# Patient Record
Sex: Male | Born: 1970
Health system: Southern US, Community
[De-identification: ages and names within clinical notes are randomized; demographics above are authoritative.]

## PROBLEM LIST (undated history)

## (undated) DIAGNOSIS — M1990 Unspecified osteoarthritis, unspecified site: Secondary | ICD-10-CM

## (undated) DIAGNOSIS — F32A Depression, unspecified: Secondary | ICD-10-CM

## (undated) DIAGNOSIS — R634 Abnormal weight loss: Secondary | ICD-10-CM

## (undated) DIAGNOSIS — R61 Generalized hyperhidrosis: Secondary | ICD-10-CM

## (undated) DIAGNOSIS — G4730 Sleep apnea, unspecified: Secondary | ICD-10-CM

## (undated) DIAGNOSIS — G43909 Migraine, unspecified, not intractable, without status migrainosus: Secondary | ICD-10-CM

## (undated) DIAGNOSIS — J302 Other seasonal allergic rhinitis: Secondary | ICD-10-CM

## (undated) DIAGNOSIS — K219 Gastro-esophageal reflux disease without esophagitis: Secondary | ICD-10-CM

## (undated) DIAGNOSIS — M25562 Pain in left knee: Secondary | ICD-10-CM

## (undated) DIAGNOSIS — F4310 Post-traumatic stress disorder, unspecified: Secondary | ICD-10-CM

## (undated) DIAGNOSIS — E0500 Thyrotoxicosis with diffuse goiter without thyrotoxic crisis or storm: Secondary | ICD-10-CM

## (undated) DIAGNOSIS — G4733 Obstructive sleep apnea (adult) (pediatric): Secondary | ICD-10-CM

## (undated) DIAGNOSIS — R200 Anesthesia of skin: Secondary | ICD-10-CM

## (undated) DIAGNOSIS — M549 Dorsalgia, unspecified: Secondary | ICD-10-CM

## (undated) DIAGNOSIS — M542 Cervicalgia: Secondary | ICD-10-CM

## (undated) DIAGNOSIS — R358 Other polyuria: Secondary | ICD-10-CM

## (undated) DIAGNOSIS — M2550 Pain in unspecified joint: Secondary | ICD-10-CM

## (undated) DIAGNOSIS — R5383 Other fatigue: Secondary | ICD-10-CM

## (undated) DIAGNOSIS — R202 Paresthesia of skin: Secondary | ICD-10-CM

## (undated) DIAGNOSIS — R0789 Other chest pain: Secondary | ICD-10-CM

## (undated) HISTORY — DX: Pain in left knee: M25.562

## (undated) HISTORY — DX: Pain in unspecified joint: M25.50

## (undated) HISTORY — PX: KNEE SURGERY: SHX244

## (undated) HISTORY — DX: Post-traumatic stress disorder, unspecified: F43.10

## (undated) HISTORY — PX: SHOULDER ARTHROSCOPY WITH DEBRIDEMENT AND BICEP TENDON REPAIR: SHX5690

## (undated) HISTORY — DX: Cervicalgia: M54.2

## (undated) HISTORY — DX: Other seasonal allergic rhinitis: J30.2

## (undated) HISTORY — DX: Sleep apnea, unspecified: G47.30

## (undated) HISTORY — DX: Dorsalgia, unspecified: M54.9

## (undated) HISTORY — DX: Other fatigue: R53.83

## (undated) HISTORY — DX: Morbid (severe) obesity due to excess calories: E66.01

## (undated) HISTORY — PX: SHOULDER SURGERY: SHX246

## (undated) HISTORY — DX: Anesthesia of skin: R20.0

## (undated) HISTORY — DX: Obstructive sleep apnea (adult) (pediatric): G47.33

## (undated) HISTORY — DX: Other chest pain: R07.89

## (undated) HISTORY — DX: Abnormal weight loss: R63.4

## (undated) HISTORY — DX: Paresthesia of skin: R20.2

## (undated) HISTORY — DX: Generalized hyperhidrosis: R61

## (undated) HISTORY — DX: Migraine, unspecified, not intractable, without status migrainosus: G43.909

## (undated) HISTORY — DX: Other polyuria: R35.8

## (undated) HISTORY — DX: Thyrotoxicosis with diffuse goiter without thyrotoxic crisis or storm: E05.00

## (undated) HISTORY — PX: OTHER SURGICAL HISTORY: SHX169

## (undated) HISTORY — DX: Depression, unspecified: F32.A

---

## 1998-09-25 ENCOUNTER — Encounter: Payer: Self-pay | Admitting: Neurosurgery

## 1998-09-25 ENCOUNTER — Ambulatory Visit (HOSPITAL_COMMUNITY): Admission: RE | Admit: 1998-09-25 | Discharge: 1998-09-25 | Payer: Self-pay | Admitting: Neurosurgery

## 2001-06-11 ENCOUNTER — Ambulatory Visit (HOSPITAL_BASED_OUTPATIENT_CLINIC_OR_DEPARTMENT_OTHER): Admission: RE | Admit: 2001-06-11 | Discharge: 2001-06-11 | Payer: Self-pay | Admitting: Orthopedic Surgery

## 2002-03-18 ENCOUNTER — Ambulatory Visit (HOSPITAL_BASED_OUTPATIENT_CLINIC_OR_DEPARTMENT_OTHER): Admission: RE | Admit: 2002-03-18 | Discharge: 2002-03-18 | Payer: Self-pay | Admitting: Orthopedic Surgery

## 2002-03-19 ENCOUNTER — Encounter: Payer: Self-pay | Admitting: Emergency Medicine

## 2002-03-19 ENCOUNTER — Emergency Department (HOSPITAL_COMMUNITY): Admission: EM | Admit: 2002-03-19 | Discharge: 2002-03-19 | Payer: Self-pay | Admitting: Emergency Medicine

## 2002-12-06 ENCOUNTER — Encounter: Admission: RE | Admit: 2002-12-06 | Discharge: 2002-12-06 | Payer: Self-pay | Admitting: Orthopedic Surgery

## 2002-12-06 ENCOUNTER — Encounter: Payer: Self-pay | Admitting: Orthopedic Surgery

## 2003-02-13 ENCOUNTER — Encounter: Payer: Self-pay | Admitting: Urology

## 2003-02-13 ENCOUNTER — Ambulatory Visit (HOSPITAL_COMMUNITY): Admission: RE | Admit: 2003-02-13 | Discharge: 2003-02-13 | Payer: Self-pay | Admitting: Urology

## 2003-12-18 ENCOUNTER — Encounter: Admission: RE | Admit: 2003-12-18 | Discharge: 2003-12-18 | Payer: Self-pay | Admitting: Sports Medicine

## 2004-06-11 ENCOUNTER — Emergency Department (HOSPITAL_COMMUNITY): Admission: EM | Admit: 2004-06-11 | Discharge: 2004-06-11 | Payer: Self-pay | Admitting: Emergency Medicine

## 2005-02-10 ENCOUNTER — Emergency Department (HOSPITAL_COMMUNITY): Admission: EM | Admit: 2005-02-10 | Discharge: 2005-02-11 | Payer: Self-pay | Admitting: Emergency Medicine

## 2006-03-03 ENCOUNTER — Encounter: Admission: RE | Admit: 2006-03-03 | Discharge: 2006-03-03 | Payer: Self-pay | Admitting: Sports Medicine

## 2008-09-09 ENCOUNTER — Emergency Department (HOSPITAL_COMMUNITY): Admission: EM | Admit: 2008-09-09 | Discharge: 2008-09-09 | Payer: Self-pay | Admitting: Emergency Medicine

## 2009-05-01 ENCOUNTER — Emergency Department (HOSPITAL_COMMUNITY): Admission: EM | Admit: 2009-05-01 | Discharge: 2009-05-01 | Payer: Self-pay | Admitting: Emergency Medicine

## 2009-05-25 ENCOUNTER — Encounter: Admission: RE | Admit: 2009-05-25 | Discharge: 2009-06-17 | Payer: Self-pay | Admitting: Family Medicine

## 2009-11-02 ENCOUNTER — Emergency Department (HOSPITAL_BASED_OUTPATIENT_CLINIC_OR_DEPARTMENT_OTHER): Admission: EM | Admit: 2009-11-02 | Discharge: 2009-11-02 | Payer: Self-pay | Admitting: Emergency Medicine

## 2010-05-14 ENCOUNTER — Encounter: Admission: RE | Admit: 2010-05-14 | Discharge: 2010-05-14 | Payer: Self-pay | Admitting: Orthopedic Surgery

## 2010-11-28 ENCOUNTER — Encounter: Payer: Self-pay | Admitting: Sports Medicine

## 2010-11-28 ENCOUNTER — Encounter: Payer: Self-pay | Admitting: Physical Medicine and Rehabilitation

## 2011-03-25 NOTE — Consult Note (Signed)
NAME:  [name], [name]                    ACCOUNT NO.:  [id number]   MEDICAL RECORD NO.:  [id number]                   PATIENT TYPE:  EMS   LOCATION:  MAJO                                 FACILITY:  [facility]   PHYSICIAN:  Willa Rough, M.D.                  DATE OF BIRTH:  11-10-1970   DATE OF CONSULTATION:  06/11/2004  DATE OF DISCHARGE:                                   CONSULTATION   SUMMARY OF HISTORY:  [name] is a 40 year old, African-American male who  is referred to [facility] Emergency Room by primary M.D. secondary to chest  discomfort.  [name] describes at least one month history of several  types of symptoms, which he has not paid particular attention to.  He first  describes dyspnea on exertion, which he attributes to being slightly  overweight.  He states that this does occur with casual walking or he has  had some shortness of breath even at rest and describes a sudden onset.  He  states that to relieve his feelings he will rest and try to relax and this  provides relief.  He denies associated pleuritic component, nausea or  vomiting.  He is not sure about diaphoresis because he sweats all the  time.  The patient also describes some chest discomfort that may occur with  or without the above symptoms.  He describes the chest discomfort as a  sharp, piercing pain that radiates into his back in the left breast area.  Questionable relief with Tums.  Usually when it would occur he would ignore  the discomfort.  He cannot rate the discomfort, but the duration would  usually last less than five minutes and could occur at any time.  Again,  this has been happening for the last month or so and the patient cannot  really elaborate on the symptoms as he usually ignores these.  However,  yesterday both of the symptoms appeared and became much worse.  He stated  that approximately 1 o'clock while at work he developed chest discomfort as  previously described.  He gave it a 7  to 8 on a scale of 0 to 10.  Later  that afternoon around 3:30 p.m. he developed shortness of breath.  He did  not have any associated nausea, vomiting or diaphoresis.  His discomfort  continued and he continued to work trying to ignore the discomfort.  He went  home and then went to church.  At home he laid down and noted some  dizziness.  He eventually fell asleep around 1:00 a.m. and woke up several  times at 3, 6:30 and 9 o'clock, noting that the sensations were still there.  He called his wife and told her that he was going to go to the physician  because of the continuing discomfort.  At [facility], he was seen  by Maren Reamer, Family Nurse Practitioner, and an EKG was performed.  The  EKG showed sinus bradycardia without any acute changes.  He was referred to  the emergency room for further evaluation.  Here in the emergency room, CK  markers have been negative times three and his preliminary lab work and  chest x-ray have all been unremarkable.  ER physician asked [place] to consult.   PAST MEDICAL HISTORY:  No known drug allergies.  He is on immunotherapy for  seasonal allergies and occasionally he takes Aleve for migraine headaches.   PAST SURGICAL HISTORY:  Notable for left shoulder surgery in May 2003 and  left knee chondroplasty.  He has not had any prior cardiac workup.  He does  not know his cholesterol status and denies any history of diabetes,  myocardial infarction, hypertension, COPD, bleeding dyscrasias or thyroid  dysfunction.   SOCIAL HISTORY:  He resides in [place] with his wife of 13 years who is  an Astronomer.  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  He has  two daughters, one son, who are all alive and well.  He quit smoking about  eight years ago; prior to that one pack per day for five years.  He has not  drank any alcohol in eight years.  Prior to that he states he rarely had  any.  He has not smoked any pot since 1997.  Denies any  other drug use.  Denies any herbal medications.  He does not follow a diet.  His exercise  consists of basketball games, lifting weights and playing golf.   REVIEW OF SYSTEMS:  Notable for previously mentioned symptoms as well as  snoring, occasional cough, rare palpitation at night which he describes an  increased heart rate and he cannot elaborate, occasional sinus congestion.  He denies any exertional limitations as he can play full court basketball in  a full game without having to stop and rest.  He denies prior occurrence of  chest discomfort and he states that the discomfort has not been a 0 since  yesterday.  Denies any associated gas or water brash.   PHYSICAL EXAMINATION:  VITAL SIGNS:  Temperature 98; blood pressure 131/75;  pulse 60 and regular; respirations 18; sats 99% on room air; weight 313  pounds; the patient is 6 feet 6 inches tall.  GENERAL:  Well-nourished, well-developed, pleasant, African-American male in  no apparent distress.  HEENT:  Normocephalic.  PERRLA, EOMs intact.  Sclerae are clear.  NECK:  Supple without thyromegaly, adenopathy, JVD or carotid bruits.  CHEST:  Symmetrical excursion.  Lungs are clear to auscultation bilaterally.  HEART:  Regular rate and rhythm.  Normal S1 and S2.  No murmurs, rubs,  clicks or gallops.  All peripheral pulses were symmetrical and intact  without femoral or abdominal bruits.  INTEGUMENT:  Intact.  He does have a tattoo on the right arm of his  daughter's name and some barbed wire.  ABDOMEN:  Slightly obese.  Bowel sounds present.  Soft without organomegaly,  masses or tenderness.  EXTREMITIES:  Negative cyanosis, clubbing or edema.  NEUROLOGIC:  Alert and oriented times three.  Cranial nerves II-XII grossly  intact.   Chest x-ray showed borderline cardiomegaly, no active disease.  EKG:  Sinus  bradycardia, normal axis, normal intervals, early R wave, nonspecific ST-T wave changes.  Old EKG not available for comparison.   H&H 14.6 and 43.0,  normal indices.  Platelets are 269.  WBC is 5.7.  Sodium 137, potassium 3.9,  BUN 10, creatinine 1.1.  IMPRESSION:  1. Prolonged chest discomfort.  2. Asymptomatic sinus bradycardia.   DISPOSITION:  Dr. Myrtis Ser reviewed the patient's history, spoke with and  examined the patient.  Dr. Myrtis Ser did not feel his discomfort was cardiac in  nature.  With negative ER cardiac markers, he does not feel cardiac workup  is needed.  He will suggest that the emergency room team do a chest CT to be  sure no dissection.  If negative, they could potentially send him home.  We  will arrange a stress Cardiolite as an outpatient and further evaluation.     Joellyn Rued, P.A. [facility]                    Willa Rough, M.D.    EW/MEDQ  D:  06/11/2004  T:  06/12/2004  Job:  [id number]   cc:   Pam Drown, M.D.  [address]  [place]  [place] [address]  Fax: [phone]   [facility]   Joen Laura, MD   Willa Rough, M.D.

## 2011-03-25 NOTE — Op Note (Signed)
[place]  Patient:    [name], [name] Visit Number: [id number] MRN: [id number]          Service Type: DSU Location: [facility] Attending Physician:  Twana First Dictated by:   Elana Alm Thurston Hole, M.D. Proc. Date: 03/18/02 Admit Date:  03/18/2002                             Operative Report  PREOPERATIVE DIAGNOSIS:  Left shoulder labrum tear.  POSTOPERATIVE DIAGNOSES: 1. Left shoulder labrum tear. 2. Left shoulder partial rotator cuff tear. 3. Left shoulder impingement.  PROCEDURE: 1. Left shoulder examination under anesthesia followed by arthroscopic partial    labrum tear debridement and partial rotator cuff tear debridement. 2. Left shoulder subacromial decompression.  SURGEON:  Elana Alm. Thurston Hole, M.D.  ASSISTANT:  Julien Girt, P.A.  ANESTHESIA:  General.  OPERATIVE TIME:  Forty-five minutes.  COMPLICATIONS:  None.  INDICATIONS FOR PROCEDURE:  [name] is a 40 year old gentleman who has had six to eight months of increasing left shoulder pain with signs and symptoms consistent with a partial labrum tear, possible para-labral cyst.  He has failed conservative care and is now to undergo arthroscopy.  DESCRIPTION OF PROCEDURE:  [name] was brought to the operating room on Mar 18, 2002 after a supraclavicular block had been placed in the holding room.  He was placed on the operative table in supine position.  His left shoulder was examined under anesthesia.  He had full range of motion and his shoulder was stable to ligamentous exam.  He was then placed in a beach chair position and his shoulder and arm were prepped using sterile Betadine and draped using sterile technique.  Initially, the arthroscopy was performed through a posterior arthroscopic portal, the arthroscope with a pump attachment was placed, and through an anterior portal, an arthroscopic probe was placed.  On initial inspection, the articular  cartilage in the glenohumeral joint was intact.  The anterior labrum showed a normal sub-foraminal recess in the superior labrum area and then a partial tear in the midportion, 20%, which was debrided.  The biceps tendon anchor was slightly mobile but a distinct labral tear/SLAP lesion were not seen.  The posterior labrum and superior labrum were intact.  The biceps tendon anchor was intact, although slightly mobile.  The biceps tendon was normal.  The inferior labrum and anteroinferior glenohumeral ligament complex were intact. The inferior capsular recess was free of pathology.  The rotator cuff was inspected.  He had a partial tear of the supraspinatus, 20%, which was debrided; the rest of the rotator cuff was found to be intact.  After this was done, again the superior labrum area was thoroughly probed and examined but no evidence of any definite SLAP lesion was seen.  After this was done, the subacromial space was entered and a lateral arthroscopic portal was made. Moderately thickened bursitis was resected.  Underneath this, the rotator cuff was inflamed and thickened but no evidence of a tear.  Subacromial decompression was carried out, removing 6 to 8 mm of the undersurface of the anterior, anterolateral and anteromedial acromion and a CA ligament release carried out; the [facility] joint was not disturbed.  After this was done, the shoulder could be brought through a full range of motion with no impingement on the rotator cuff.  At this point, it was felt that all pathology had been satisfactorily addressed.  The instruments were removed.  Portals were closed with 3-0 nylon suture and injected with 0.25% Marcaine with epinephrine, sterile dressings and a sling applied and the patient awakened and taken to the recovery room in stable condition.  FOLLOWUP CARE:  [name] will be followed as an outpatient on Vicodin and Naprosyn, with early physical therapy.  I will see him back in the  office in a week for sutures out and followup. Dictated by:   Elana Alm Thurston Hole, M.D. Attending Physician:  Twana First DD:  03/18/02 TD:  03/19/02 Job: [id number]

## 2011-03-25 NOTE — Op Note (Signed)
[place]  Patient:    [name], [name]                   MRN: [id number] Proc. Date: 06/11/01 Adm. Date:  [id number] Attending:  Twana First                           Operative Report  PREOPERATIVE DIAGNOSIS:  Left knee plica and synovitis, with chondromalacia.  POSTOPERATIVE DIAGNOSIS:  Left knee plica and synovitis, with chondromalacia.  PROCEDURE: 1. Left knee examination under anesthesia, followed by an arthroscopic medial    plica excision with partial synovectomy. 2. Left knee chondroplasty.  SURGEON:  Elana Alm. Thurston Hole, M.D.  ASSISTANT:  Julien Girt, P.A.  ANESTHESIA:  Local and MAC.  OPERATIVE TIME:  30 minutes.  COMPLICATIONS:  None.  INDICATIONS:  [name] is a 41 year old gentleman who has had significant left knee pain for the past five to six months, increasing in nature, with signs and symptoms consistent with plica and synovitis, who has failed conservative care and is now to undergo an arthroscopy.  DESCRIPTION OF PROCEDURE:  [name] was brought to the operating room on June 11, 2001, and placed on the operative table in the supine position.  His left knee was examined under anesthesia.  Range of motion was 0-125 degrees, 1-2+ crepitation.  The knee was stable to ligamentous examination with normal patella tracking.  The left knee was prepped using sterile Betadine and draped using sterile technique.  Originally through an inferolateral portal the arthroscope with a pump attached was placed, and through an inferior medial portal an arthroscopic probe was placed.  On initial inspection of the medial compartment the articular cartilage, medial femoral condyle, and medial tibial plateau were found to be normal.  The medial meniscus was probed , and this was found to be normal.  The inner condylar notch was inspected.  The anterior and posterior cruciate ligaments were normal.  The lateral compartment  was inspected.  The articular cartilage and lateral femoral condyle and lateral tibial plateau were normal.  The lateral meniscus was probed, and this was found to be normal.  The patellofemoral joint was inspected. The articular cartilage in this joint showed mild grade 1-2 chondromalacia.  The patella tracked normally.  There was a large synovial medial plica band which was thoroughly debrided, and the synovial bleeders were cauterized.  Lateral synovitis was debrided as well.  After this was done the medial and lateral gutters otherwise were inspected and were found to be free of pathology.  The patella tracked normally.  At this point it was felt that all pathology had been satisfactorily addressed.  The instruments were removed.  The portals were closed with #3-0 nylon suture and injected with 0.25% Marcaine with epinephrine and 4 mg of morphine.  Sterile dressings were applied.  The patient was awakened and taken to the recovery room in stable condition.  FOLLOW-UP CARE:  [name] will be followed as an outpatient on Vicodin and Naprosyn.  I will see him back in the office in one week for sutures out and followup. DD:  06/11/01 TD:  06/11/01 Job: [id number]

## 2011-07-07 ENCOUNTER — Other Ambulatory Visit (HOSPITAL_COMMUNITY): Payer: Self-pay | Admitting: Chiropractic Medicine

## 2011-07-07 DIAGNOSIS — M545 Low back pain, unspecified: Secondary | ICD-10-CM

## 2011-07-14 ENCOUNTER — Ambulatory Visit (HOSPITAL_COMMUNITY)
Admission: RE | Admit: 2011-07-14 | Discharge: 2011-07-14 | Disposition: A | Discharge status: Home / Self Care | Payer: Self-pay | Source: Ambulatory Visit | Attending: Chiropractic Medicine | Admitting: Chiropractic Medicine

## 2011-07-14 DIAGNOSIS — M5137 Other intervertebral disc degeneration, lumbosacral region: Secondary | ICD-10-CM | POA: Insufficient documentation

## 2011-07-14 DIAGNOSIS — M51379 Other intervertebral disc degeneration, lumbosacral region without mention of lumbar back pain or lower extremity pain: Secondary | ICD-10-CM | POA: Insufficient documentation

## 2011-07-14 DIAGNOSIS — M545 Low back pain, unspecified: Secondary | ICD-10-CM

## 2011-07-14 DIAGNOSIS — R29898 Other symptoms and signs involving the musculoskeletal system: Secondary | ICD-10-CM | POA: Insufficient documentation

## 2011-08-09 LAB — POCT I-STAT, CHEM 8
BUN: 16
Calcium, Ion: 1.23
Chloride: 103
Creatinine, Ser: 1.1
Glucose, Bld: 86
HCT: 41
Hemoglobin: 13.9
Potassium: 4
Sodium: 139
TCO2: 27

## 2011-08-09 LAB — CBC
HCT: 41.3
Hemoglobin: 14.1
MCHC: 34.1
MCV: 90.2
Platelets: 267
RDW: 13.4
WBC: 5.7

## 2011-08-09 LAB — DIFFERENTIAL
Basophils Absolute: 0
Basophils Relative: 1

## 2011-08-09 LAB — POCT CARDIAC MARKERS
CKMB, poc: <1 — ABNORMAL LOW
Myoglobin, poc: 56
Troponin i, poc: <0.05

## 2011-09-08 ENCOUNTER — Other Ambulatory Visit (HOSPITAL_COMMUNITY): Payer: Self-pay | Admitting: Family Medicine

## 2011-09-08 DIAGNOSIS — R110 Nausea: Secondary | ICD-10-CM

## 2011-09-09 ENCOUNTER — Inpatient Hospital Stay (HOSPITAL_COMMUNITY): Admission: RE | Admit: 2011-09-09 | Payer: Self-pay | Source: Ambulatory Visit

## 2011-09-09 ENCOUNTER — Encounter (HOSPITAL_COMMUNITY)
Admission: RE | Admit: 2011-09-09 | Discharge: 2011-09-09 | Disposition: A | Discharge status: Home / Self Care | Payer: 59 | Source: Ambulatory Visit | Attending: Family Medicine | Admitting: Family Medicine

## 2011-09-09 ENCOUNTER — Ambulatory Visit (HOSPITAL_COMMUNITY)
Admission: RE | Admit: 2011-09-09 | Discharge: 2011-09-09 | Disposition: A | Discharge status: Home / Self Care | Payer: 59 | Source: Ambulatory Visit | Attending: Family Medicine | Admitting: Family Medicine

## 2011-09-09 DIAGNOSIS — R110 Nausea: Secondary | ICD-10-CM

## 2011-09-09 DIAGNOSIS — R1013 Epigastric pain: Secondary | ICD-10-CM | POA: Insufficient documentation

## 2011-09-09 DIAGNOSIS — R002 Palpitations: Secondary | ICD-10-CM | POA: Insufficient documentation

## 2012-02-15 ENCOUNTER — Other Ambulatory Visit: Payer: Self-pay | Admitting: Family Medicine

## 2012-02-15 DIAGNOSIS — R51 Headache: Secondary | ICD-10-CM

## 2012-02-27 ENCOUNTER — Other Ambulatory Visit: Payer: 59

## 2012-03-01 ENCOUNTER — Ambulatory Visit
Admission: RE | Admit: 2012-03-01 | Discharge: 2012-03-01 | Disposition: A | Discharge status: Home / Self Care | Payer: 59 | Source: Ambulatory Visit | Attending: Family Medicine | Admitting: Family Medicine

## 2012-03-01 DIAGNOSIS — R51 Headache: Secondary | ICD-10-CM

## 2012-03-23 ENCOUNTER — Other Ambulatory Visit: Payer: Self-pay | Admitting: Orthopedic Surgery

## 2012-03-23 DIAGNOSIS — M5410 Radiculopathy, site unspecified: Secondary | ICD-10-CM

## 2012-04-04 ENCOUNTER — Ambulatory Visit
Admission: RE | Admit: 2012-04-04 | Discharge: 2012-04-04 | Disposition: A | Discharge status: Home / Self Care | Payer: 59 | Source: Ambulatory Visit | Attending: Orthopedic Surgery | Admitting: Orthopedic Surgery

## 2012-04-04 DIAGNOSIS — M5410 Radiculopathy, site unspecified: Secondary | ICD-10-CM

## 2013-03-20 ENCOUNTER — Other Ambulatory Visit: Payer: Self-pay | Admitting: Family Medicine

## 2013-03-21 ENCOUNTER — Other Ambulatory Visit: Payer: Self-pay | Admitting: Family Medicine

## 2013-03-21 DIAGNOSIS — IMO0001 Reserved for inherently not codable concepts without codable children: Secondary | ICD-10-CM

## 2013-04-03 ENCOUNTER — Ambulatory Visit
Admission: RE | Admit: 2013-04-03 | Discharge: 2013-04-03 | Disposition: A | Discharge status: Home / Self Care | Payer: 59 | Source: Ambulatory Visit | Attending: Family Medicine | Admitting: Family Medicine

## 2013-04-03 DIAGNOSIS — IMO0001 Reserved for inherently not codable concepts without codable children: Secondary | ICD-10-CM

## 2013-12-31 ENCOUNTER — Other Ambulatory Visit: Payer: Self-pay | Admitting: Orthopedic Surgery

## 2013-12-31 DIAGNOSIS — M542 Cervicalgia: Secondary | ICD-10-CM

## 2014-01-05 ENCOUNTER — Ambulatory Visit
Admission: RE | Admit: 2014-01-05 | Discharge: 2014-01-05 | Disposition: A | Discharge status: Home / Self Care | Payer: 59 | Source: Ambulatory Visit | Attending: Orthopedic Surgery | Admitting: Orthopedic Surgery

## 2014-01-05 DIAGNOSIS — M542 Cervicalgia: Secondary | ICD-10-CM

## 2014-11-20 ENCOUNTER — Other Ambulatory Visit: Payer: Self-pay | Admitting: Family Medicine

## 2014-11-21 ENCOUNTER — Ambulatory Visit
Admission: RE | Admit: 2014-11-21 | Discharge: 2014-11-21 | Disposition: A | Discharge status: Home / Self Care | Payer: 59 | Source: Ambulatory Visit | Attending: Family Medicine | Admitting: Family Medicine

## 2014-11-21 ENCOUNTER — Other Ambulatory Visit: Payer: Self-pay | Admitting: Family Medicine

## 2014-11-21 DIAGNOSIS — M545 Low back pain: Secondary | ICD-10-CM

## 2015-01-08 ENCOUNTER — Encounter: Payer: 59 | Attending: Family Medicine | Admitting: Dietician

## 2015-01-08 ENCOUNTER — Encounter: Payer: Self-pay | Admitting: Dietician

## 2015-01-08 VITALS — Ht 77.0 in | Wt 323.5 lb

## 2015-01-08 DIAGNOSIS — E669 Obesity, unspecified: Secondary | ICD-10-CM

## 2015-01-08 DIAGNOSIS — Z6838 Body mass index (BMI) 38.0-38.9, adult: Secondary | ICD-10-CM | POA: Insufficient documentation

## 2015-01-08 DIAGNOSIS — Z713 Dietary counseling and surveillance: Secondary | ICD-10-CM | POA: Diagnosis not present

## 2015-01-08 NOTE — Patient Instructions (Addendum)
Think about having one splurge per week. Continue exercising 4 x week. Fill half of your plate with vegetables at lunch and dinner. For snacks have protein and carbs together (see list). Portion snacks out. Try to take 20 minutes to eat meals. Put fork down between bites. Sip water. Count to 20-30 chews per bite. Pay attention to hunger/fullness feelings. Eat when you are hungry, don't eat if you aren't hungry.

## 2015-01-08 NOTE — Progress Notes (Signed)
  Medical Nutrition Therapy:  Appt start time: 1100 end time:  1150.   Assessment:  Primary concerns today: [name] is here since he needs to lose 100 lbs since he has degenerative disc disease. Had a procedure last week and is not sure if that is helping. Doctor recommended that talk to a dietitian. [place] to [place] and had an injury in his back in his 20's and has had problems ever since. Was taking prednisone and gained up to 359 lbs 2 years ago. Started working out at that time and lost some weight (about 30 lbs). Tried some diet pills in the past which works but they came off [employer]  Works as a Gaffer and works normal business hours. Lives with his wife and 2 kids. He might miss some meals on the weekend. Eats out about 10 meals per week.   Coaches middle school basketball. Has been trying to eat better in the past 2 weeks. Using MyFitness Pal. Craves sweets. Has cut back on portions, eating when hungry, and cutting back on higher fat food.   Has not been sleeping well and uses CPAP machine (not used to it yet). Has lost about 20 lbs since he started making changes in the past 2 weeks. First goal is to get down to 300 lbs.  Preferred Learning Style:   No preference indicated   Learning Readiness:   Ready  MEDICATIONS: see list   DIETARY INTAKE:  Avoided foods include none  24-hr recall:  B ( AM): protein shake [place]ada lean with citrucil or low carb plate from [place] Snk ( AM): sometimes might have [employer] or fruit, [place] strips L ( PM): spinach wrap with chicken, salsa, corn with greek yogurt or chicken with vegetables Snk ( PM): orange with kettle corn D ( PM): [employer] terriakyi chicken with brown rice Snk ( PM): not usually might [place] yogurt Beverages: water, unsweet tea with splenda, coffee rarely  Usual physical activity: every Saturday plays basketball, golf every once in a while, goes to the gym for 3-4 x  week for about 90 minutes  Estimated energy needs: 2400 calories 270 g carbohydrates 180 g protein 67 g fat  Progress Towards Goal(s):  In progress.   Nutritional Diagnosis:  [place]-3.3 Overweight/obesity As related to hx of large portion sizes and energy dense food choices.  As evidenced by BMI of 38.4.    Intervention:  Nutrition counseling provided. Plan: Think about having one splurge per week. Continue exercising 4 x week. Fill half of your plate with vegetables at lunch and dinner. For snacks have protein and carbs together (see list). Portion snacks out. Try to take 20 minutes to eat meals. Put fork down between bites. Sip water. Count to 20-30 chews per bite. Pay attention to hunger/fullness feelings. Eat when you are hungry, don't eat if you aren't hungry.   Teaching Method Utilized:  Visual Auditory Hands on  Handouts given during visit include:  MyPlate Handout  15 g CHO Snacks  Barriers to learning/adherence to lifestyle change: none  Demonstrated degree of understanding via:  Teach Back   Monitoring/Evaluation:  Dietary intake, exercise, and body weight in 2 month(s).

## 2015-02-10 ENCOUNTER — Other Ambulatory Visit: Payer: Self-pay | Admitting: Physical Medicine and Rehabilitation

## 2015-02-10 DIAGNOSIS — M79602 Pain in left arm: Secondary | ICD-10-CM

## 2015-02-10 DIAGNOSIS — M79604 Pain in right leg: Secondary | ICD-10-CM

## 2015-02-10 DIAGNOSIS — M542 Cervicalgia: Secondary | ICD-10-CM

## 2015-03-04 ENCOUNTER — Ambulatory Visit
Admission: RE | Admit: 2015-03-04 | Discharge: 2015-03-04 | Disposition: A | Discharge status: Home / Self Care | Payer: 59 | Source: Ambulatory Visit | Attending: Physical Medicine and Rehabilitation | Admitting: Physical Medicine and Rehabilitation

## 2015-03-04 DIAGNOSIS — M79604 Pain in right leg: Secondary | ICD-10-CM

## 2015-03-04 DIAGNOSIS — M79602 Pain in left arm: Secondary | ICD-10-CM

## 2015-03-04 DIAGNOSIS — M542 Cervicalgia: Secondary | ICD-10-CM

## 2015-03-10 ENCOUNTER — Ambulatory Visit: Payer: 59 | Admitting: Dietician

## 2015-03-30 ENCOUNTER — Encounter (HOSPITAL_COMMUNITY): Payer: Self-pay

## 2015-03-30 ENCOUNTER — Encounter (HOSPITAL_COMMUNITY)
Admission: RE | Admit: 2015-03-30 | Discharge: 2015-03-30 | Disposition: A | Discharge status: Home / Self Care | Payer: 59 | Source: Ambulatory Visit | Attending: Orthopedic Surgery | Admitting: Orthopedic Surgery

## 2015-03-30 ENCOUNTER — Ambulatory Visit (HOSPITAL_COMMUNITY)
Admission: RE | Admit: 2015-03-30 | Payer: 59 | Source: Ambulatory Visit | Attending: Orthopedic Surgery | Admitting: Orthopedic Surgery

## 2015-03-30 DIAGNOSIS — Z01812 Encounter for preprocedural laboratory examination: Secondary | ICD-10-CM | POA: Diagnosis not present

## 2015-03-30 DIAGNOSIS — M7541 Impingement syndrome of right shoulder: Secondary | ICD-10-CM | POA: Diagnosis not present

## 2015-03-30 HISTORY — DX: Gastro-esophageal reflux disease without esophagitis: K21.9

## 2015-03-30 HISTORY — DX: Unspecified osteoarthritis, unspecified site: M19.90

## 2015-03-30 LAB — CBC WITH DIFFERENTIAL/PLATELET
Basophils Absolute: 0 10*3/uL (ref 0.0–0.1)
Basophils Relative: 0 % (ref 0–1)
Eosinophils Absolute: 0.1 10*3/uL (ref 0.0–0.7)
Eosinophils Relative: 2 % (ref 0–5)
HCT: 40.8 % (ref 39.0–52.0)
Hemoglobin: 14.2 g/dL (ref 13.0–17.0)
LYMPHS PCT: 44 % (ref 12–46)
Lymphs Abs: 2 10*3/uL (ref 0.7–4.0)
MCH: 29.9 pg (ref 26.0–34.0)
MCHC: 34.8 g/dL (ref 30.0–36.0)
MCV: 85.9 fL (ref 78.0–100.0)
MONO ABS: 0.3 10*3/uL (ref 0.1–1.0)
Monocytes Relative: 6 % (ref 3–12)
NEUTROS PCT: 48 % (ref 43–77)
Neutro Abs: 2.2 10*3/uL (ref 1.7–7.7)
PLATELETS: 218 10*3/uL (ref 150–400)
RBC: 4.75 MIL/uL (ref 4.22–5.81)
RDW: 13.1 % (ref 11.5–15.5)
WBC: 4.6 10*3/uL (ref 4.0–10.5)

## 2015-03-30 LAB — COMPREHENSIVE METABOLIC PANEL
ALT: 24 U/L (ref 17–63)
ANION GAP: 7 (ref 5–15)
AST: 27 U/L (ref 15–41)
Albumin: 4.3 g/dL (ref 3.5–5.0)
Alkaline Phosphatase: 42 U/L (ref 38–126)
BUN: 13 mg/dL (ref 6–20)
CHLORIDE: 105 mmol/L (ref 101–111)
CO2: 26 mmol/L (ref 22–32)
Calcium: 9.5 mg/dL (ref 8.9–10.3)
Creatinine, Ser: 1.04 mg/dL (ref 0.61–1.24)
GFR calc non Af Amer: >60 mL/min (ref >60)
Glucose, Bld: 91 mg/dL (ref 65–99)
POTASSIUM: 4.1 mmol/L (ref 3.5–5.1)
SODIUM: 138 mmol/L (ref 135–145)
Total Bilirubin: 0.8 mg/dL (ref 0.3–1.2)
Total Protein: 7 g/dL (ref 6.5–8.1)

## 2015-03-30 LAB — PROTIME-INR
INR: 1.04 (ref 0.00–1.49)
Prothrombin Time: 13.8 seconds (ref 11.6–15.2)

## 2015-03-30 LAB — APTT: APTT: 32 s (ref 24–37)

## 2015-03-30 NOTE — Pre-Procedure Instructions (Addendum)
AMEER SANDEN  03/30/2015      [id number] [place], [place] - [place] [address] Phone: [phone] Fax: [phone]    Your procedure is scheduled on 04/09/15  Report to [facility] short stay admitting at 1030 A.M.  Call this number if you have problems the morning of surgery:  [phone]   Remember:  Do not eat food or drink liquids after midnight.  Take these medicines the morning of surgery with A SIP OF WATER allergy med and flonase if needed    STOP all herbel meds, nsaids (aleve,naproxen,advil,ibuprofen) 5 days prior to surgery starting 04/04/15 including aspirin, vitamins, fish oil   Do not wear jewelry, make-up or nail polish.  Do not wear lotions, powders, or perfumes.  You may not wear deodorant.  Do not shave 48 hours prior to surgery.  Men may shave face and neck.  Do not bring valuables to the hospital.  [facility] is not responsible for any belongings or valuables.  Contacts, dentures or bridgework may not be worn into surgery.  Leave your suitcase in the car.  After surgery it may be brought to your room.  For patients admitted to the hospital, discharge time will be determined by your treatment team.  Patients discharged the day of surgery will not be allowed to drive home.   Name and phone number of your driver:    Special instructions:   Special Instructions: [place] - Preparing for Surgery  Before surgery, you can play an important role.  Because skin is not sterile, your skin needs to be as free of germs as possible.  You can reduce the number of germs on you skin by washing with CHG (chlorahexidine gluconate) soap before surgery.  CHG is an antiseptic cleaner which kills germs and bonds with the skin to continue killing germs even after washing.  Please DO NOT use if you have an allergy to CHG or antibacterial soaps.  If your skin becomes reddened/irritated stop using the CHG and inform your nurse when  you arrive at Short Stay.  Do not shave (including legs and underarms) for at least 48 hours prior to the first CHG shower.  You may shave your face.  Please follow these instructions carefully:   1.  Shower with CHG Soap the night before surgery and the morning of Surgery.  2.  If you choose to wash your hair, wash your hair first as usual with your normal shampoo.  3.  After you shampoo, rinse your hair and body thoroughly to remove the Shampoo.  4.  Use CHG as you would any other liquid soap.  You can apply chg directly  to the skin and wash gently with scrungie or a clean washcloth.  5.  Apply the CHG Soap to your body ONLY FROM THE NECK DOWN.  Do not use on open wounds or open sores.  Avoid contact with your eyes ears, mouth and genitals (private parts).  Wash genitals (private parts)       with your normal soap.  6.  Wash thoroughly, paying special attention to the area where your surgery will be performed.  7.  Thoroughly rinse your body with warm water from the neck down.  8.  DO NOT shower/wash with your normal soap after using and rinsing off the CHG Soap.  9.  Pat yourself dry with a clean towel.            10.  Wear clean pajamas.            11.  Place clean sheets on your bed the night of your first shower and do not sleep with pets.  Day of Surgery  Do not apply any lotions/deodorants the morning of surgery.  Please wear clean clothes to the hospital/surgery center.  Please read over the following fact sheets that you were given. Pain Booklet, Coughing and Deep Breathing and Surgical Site Infection Prevention

## 2015-04-03 ENCOUNTER — Encounter (HOSPITAL_COMMUNITY): Payer: Self-pay | Admitting: Pharmacy Technician

## 2015-04-08 MED ORDER — DEXTROSE 5 % IV SOLN
3.0000 g | INTRAVENOUS | Status: AC
  Administered 2015-04-09: 3 g via INTRAVENOUS
  Filled 2015-04-08 (×2): qty 3000

## 2015-04-08 MED ORDER — LACTATED RINGERS IV SOLN
INTRAVENOUS | Status: DC
  Administered 2015-04-09 (×3): via INTRAVENOUS

## 2015-04-08 NOTE — Progress Notes (Signed)
Notified patient of time change and instructed him to arrive at 800 am. 

## 2015-04-09 ENCOUNTER — Encounter (HOSPITAL_COMMUNITY)
Admission: RE | Disposition: A | Discharge status: Home / Self Care | Payer: Self-pay | Source: Ambulatory Visit | Attending: Orthopedic Surgery

## 2015-04-09 ENCOUNTER — Ambulatory Visit (HOSPITAL_COMMUNITY): Payer: Commercial Managed Care - HMO | Admitting: Anesthesiology

## 2015-04-09 ENCOUNTER — Ambulatory Visit (HOSPITAL_COMMUNITY)
Admission: RE | Admit: 2015-04-09 | Discharge: 2015-04-09 | Disposition: A | Discharge status: Home / Self Care | Payer: Commercial Managed Care - HMO | Source: Ambulatory Visit | Attending: Orthopedic Surgery | Admitting: Orthopedic Surgery

## 2015-04-09 ENCOUNTER — Encounter (HOSPITAL_COMMUNITY): Payer: Self-pay

## 2015-04-09 DIAGNOSIS — M24111 Other articular cartilage disorders, right shoulder: Secondary | ICD-10-CM | POA: Diagnosis not present

## 2015-04-09 DIAGNOSIS — M12811 Other specific arthropathies, not elsewhere classified, right shoulder: Secondary | ICD-10-CM | POA: Insufficient documentation

## 2015-04-09 DIAGNOSIS — K219 Gastro-esophageal reflux disease without esophagitis: Secondary | ICD-10-CM | POA: Insufficient documentation

## 2015-04-09 DIAGNOSIS — M75101 Unspecified rotator cuff tear or rupture of right shoulder, not specified as traumatic: Secondary | ICD-10-CM | POA: Diagnosis not present

## 2015-04-09 DIAGNOSIS — Z6837 Body mass index (BMI) 37.0-37.9, adult: Secondary | ICD-10-CM | POA: Insufficient documentation

## 2015-04-09 DIAGNOSIS — M7541 Impingement syndrome of right shoulder: Secondary | ICD-10-CM | POA: Diagnosis not present

## 2015-04-09 DIAGNOSIS — Z87891 Personal history of nicotine dependence: Secondary | ICD-10-CM | POA: Diagnosis not present

## 2015-04-09 DIAGNOSIS — G4730 Sleep apnea, unspecified: Secondary | ICD-10-CM | POA: Insufficient documentation

## 2015-04-09 HISTORY — PX: SHOULDER ARTHROSCOPY WITH SUBACROMIAL DECOMPRESSION, ROTATOR CUFF REPAIR AND BICEP TENDON REPAIR: SHX5687

## 2015-04-09 SURGERY — SHOULDER ARTHROSCOPY WITH SUBACROMIAL DECOMPRESSION, ROTATOR CUFF REPAIR AND BICEP TENDON REPAIR
Anesthesia: Regional | Site: Shoulder | Laterality: Right

## 2015-04-09 MED ORDER — SUCCINYLCHOLINE CHLORIDE 200 MG/10ML IV SOSY
PREFILLED_SYRINGE | INTRAVENOUS | Status: DC | PRN
  Administered 2015-04-09: 120 mg via INTRAVENOUS

## 2015-04-09 MED ORDER — FENTANYL CITRATE (PF) 100 MCG/2ML IJ SOLN
INTRAMUSCULAR | Status: AC
  Administered 2015-04-09: 50 ug
  Filled 2015-04-09: qty 2

## 2015-04-09 MED ORDER — NEOSTIGMINE METHYLSULFATE 10 MG/10ML IV SOLN
INTRAVENOUS | Status: DC | PRN
  Administered 2015-04-09: 4 mg via INTRAVENOUS

## 2015-04-09 MED ORDER — GLYCOPYRROLATE 0.2 MG/ML IJ SOLN
INTRAMUSCULAR | Status: DC | PRN
  Administered 2015-04-09: 0.6 mg via INTRAVENOUS
  Administered 2015-04-09: 0.2 mg via INTRAVENOUS

## 2015-04-09 MED ORDER — MIDAZOLAM HCL 2 MG/2ML IJ SOLN
INTRAMUSCULAR | Status: AC
  Administered 2015-04-09: 2 mg
  Filled 2015-04-09: qty 2

## 2015-04-09 MED ORDER — SODIUM CHLORIDE 0.9 % IR SOLN
Status: DC | PRN
  Administered 2015-04-09 (×2): 3000 mL

## 2015-04-09 MED ORDER — MIDAZOLAM HCL 5 MG/5ML IJ SOLN
INTRAMUSCULAR | Status: DC | PRN
  Administered 2015-04-09: 2 mg via INTRAVENOUS

## 2015-04-09 MED ORDER — OXYCODONE HCL 5 MG/5ML PO SOLN: 5.0000 mg | Freq: Once | ORAL | Status: DC | PRN

## 2015-04-09 MED ORDER — ONDANSETRON HCL 4 MG/2ML IJ SOLN
INTRAMUSCULAR | Status: DC | PRN
  Administered 2015-04-09: 4 mg via INTRAVENOUS

## 2015-04-09 MED ORDER — CHLORHEXIDINE GLUCONATE 4 % EX LIQD: 60.0000 mL | Freq: Once | CUTANEOUS | Status: DC

## 2015-04-09 MED ORDER — OXYCODONE HCL 5 MG PO TABS: 5.0000 mg | ORAL_TABLET | Freq: Once | ORAL | Status: DC | PRN

## 2015-04-09 MED ORDER — DIAZEPAM 5 MG PO TABS: 2.5000 mg | ORAL_TABLET | Freq: Four times a day (QID) | ORAL | Status: DC | PRN

## 2015-04-09 MED ORDER — OXYCODONE-ACETAMINOPHEN 5-325 MG PO TABS: 1.0000 | ORAL_TABLET | ORAL | Status: DC | PRN

## 2015-04-09 MED ORDER — PROPOFOL 10 MG/ML IV BOLUS
INTRAVENOUS | Status: DC | PRN
  Administered 2015-04-09: 300 mg via INTRAVENOUS

## 2015-04-09 MED ORDER — NAPROXEN 500 MG PO TABS: 500.0000 mg | ORAL_TABLET | Freq: Two times a day (BID) | ORAL | Status: DC

## 2015-04-09 MED ORDER — BUPIVACAINE-EPINEPHRINE (PF) 0.5% -1:200000 IJ SOLN
INTRAMUSCULAR | Status: DC | PRN
  Administered 2015-04-09: 30 mL via PERINEURAL

## 2015-04-09 MED ORDER — FENTANYL CITRATE (PF) 100 MCG/2ML IJ SOLN
INTRAMUSCULAR | Status: DC | PRN
  Administered 2015-04-09: 100 ug via INTRAVENOUS

## 2015-04-09 MED ORDER — HYDROMORPHONE HCL 1 MG/ML IJ SOLN: 0.2500 mg | INTRAMUSCULAR | Status: DC | PRN

## 2015-04-09 MED ORDER — ROCURONIUM BROMIDE 100 MG/10ML IV SOLN
INTRAVENOUS | Status: DC | PRN
  Administered 2015-04-09: 50 mg via INTRAVENOUS

## 2015-04-09 MED ORDER — PROMETHAZINE HCL 25 MG/ML IJ SOLN: 6.2500 mg | INTRAMUSCULAR | Status: DC | PRN

## 2015-04-09 MED ORDER — KETOROLAC TROMETHAMINE 30 MG/ML IJ SOLN: 30.0000 mg | Freq: Once | INTRAMUSCULAR | Status: DC | PRN

## 2015-04-09 SURGICAL SUPPLY — 65 items
BLADE CUTTER GATOR 3.5 (BLADE) ×2 IMPLANT
BLADE GREAT WHITE 4.2 (BLADE) ×2 IMPLANT
BLADE SURG 11 STRL SS (BLADE) ×2 IMPLANT
BOOTCOVER CLEANROOM LRG (PROTECTIVE WEAR) ×4 IMPLANT
BUR OVAL 4.0 (BURR) ×2 IMPLANT
CANISTER SUCT LVC 12 LTR MEDI- (MISCELLANEOUS) ×2 IMPLANT
CANNULA ACUFLEX KIT 5X76 (CANNULA) ×2 IMPLANT
CANNULA DRILOCK 5.0X75 (CANNULA) ×2 IMPLANT
CLSR STERI-STRIP ANTIMIC 1/2X4 (GAUZE/BANDAGES/DRESSINGS) ×1 IMPLANT
CONNECTOR 5 IN 1 STRAIGHT STRL (MISCELLANEOUS) ×2 IMPLANT
DRAPE INCISE 23X17 IOBAN STRL (DRAPES)
DRAPE INCISE 23X17 STRL (DRAPES) IMPLANT
DRAPE INCISE IOBAN 23X17 STRL (DRAPES) IMPLANT
DRAPE STERI 35X30 U-POUCH (DRAPES) IMPLANT
DRAPE SURG 17X11 SM STRL (DRAPES) ×2 IMPLANT
DRAPE U-SHAPE 47X51 STRL (DRAPES) ×1 IMPLANT
DRSG PAD ABDOMINAL 8X10 ST (GAUZE/BANDAGES/DRESSINGS) ×3 IMPLANT
DURAPREP 26ML APPLICATOR (WOUND CARE) ×3 IMPLANT
ELECT REM PT RETURN 9FT ADLT (ELECTROSURGICAL) ×2
ELECTRODE REM PT RTRN 9FT ADLT (ELECTROSURGICAL) ×1 IMPLANT
GAUZE SPONGE 4X4 12PLY STRL (GAUZE/BANDAGES/DRESSINGS) ×2 IMPLANT
GLOVE BIO SURGEON STRL SZ 6.5 (GLOVE) ×1 IMPLANT
GLOVE BIO SURGEON STRL SZ7 (GLOVE) ×1 IMPLANT
GLOVE BIO SURGEON STRL SZ7.5 (GLOVE) ×2 IMPLANT
GLOVE BIO SURGEON STRL SZ8 (GLOVE) ×2 IMPLANT
GLOVE BIOGEL PI IND STRL 6.5 (GLOVE) IMPLANT
GLOVE BIOGEL PI IND STRL 7.0 (GLOVE) IMPLANT
GLOVE BIOGEL PI INDICATOR 6.5 (GLOVE) ×1
GLOVE BIOGEL PI INDICATOR 7.0 (GLOVE) ×1
GLOVE EUDERMIC 7 POWDERFREE (GLOVE) ×2 IMPLANT
GLOVE SS BIOGEL STRL SZ 7.5 (GLOVE) ×1 IMPLANT
GLOVE SUPERSENSE BIOGEL SZ 7.5 (GLOVE) ×1
GOWN STRL REUS W/ TWL LRG LVL3 (GOWN DISPOSABLE) ×1 IMPLANT
GOWN STRL REUS W/ TWL XL LVL3 (GOWN DISPOSABLE) ×4 IMPLANT
GOWN STRL REUS W/TWL LRG LVL3 (GOWN DISPOSABLE) ×4
GOWN STRL REUS W/TWL XL LVL3 (GOWN DISPOSABLE) ×4
KIT BASIN OR (CUSTOM PROCEDURE TRAY) ×2 IMPLANT
KIT ROOM TURNOVER OR (KITS) ×2 IMPLANT
KIT SHOULDER TRACTION (DRAPES) ×2 IMPLANT
MANIFOLD NEPTUNE II (INSTRUMENTS) ×2 IMPLANT
NDL SPNL 18GX3.5 QUINCKE PK (NEEDLE) ×1 IMPLANT
NDL SUT 6 .5 CRC .975X.05 MAYO (NEEDLE) IMPLANT
NEEDLE MAYO TAPER (NEEDLE)
NEEDLE SPNL 18GX3.5 QUINCKE PK (NEEDLE) ×2 IMPLANT
NS IRRIG 1000ML POUR BTL (IV SOLUTION) ×2 IMPLANT
PACK SHOULDER (CUSTOM PROCEDURE TRAY) ×2 IMPLANT
PAD ARMBOARD 7.5X6 YLW CONV (MISCELLANEOUS) ×5 IMPLANT
SET ARTHROSCOPY TUBING (MISCELLANEOUS) ×2
SET ARTHROSCOPY TUBING LN (MISCELLANEOUS) ×1 IMPLANT
SLING ARM LRG ADULT FOAM STRAP (SOFTGOODS) IMPLANT
SLING ARM MED ADULT FOAM STRAP (SOFTGOODS) ×1 IMPLANT
SLING ARM XL FOAM STRAP (SOFTGOODS) ×1 IMPLANT
SPONGE LAP 4X18 X RAY DECT (DISPOSABLE) IMPLANT
STRIP CLOSURE SKIN 1/2X4 (GAUZE/BANDAGES/DRESSINGS) ×1 IMPLANT
SUT MNCRL AB 3-0 PS2 18 (SUTURE) ×2 IMPLANT
SUT MNCRL AB 4-0 PS2 18 (SUTURE) ×1 IMPLANT
SUT PDS AB 0 CT 36 (SUTURE) IMPLANT
SUT RETRIEVER GRASP 30 DEG (SUTURE) IMPLANT
SYR 20CC LL (SYRINGE) IMPLANT
TAPE CLOTH SURG 4X10 WHT LF (GAUZE/BANDAGES/DRESSINGS) ×1 IMPLANT
TAPE PAPER 3X10 WHT MICROPORE (GAUZE/BANDAGES/DRESSINGS) ×1 IMPLANT
TOWEL OR 17X24 6PK STRL BLUE (TOWEL DISPOSABLE) ×2 IMPLANT
TOWEL OR 17X26 10 PK STRL BLUE (TOWEL DISPOSABLE) ×2 IMPLANT
WAND SUCTION MAX 4MM 90S (SURGICAL WAND) ×2 IMPLANT
WATER STERILE IRR 1000ML POUR (IV SOLUTION) ×2 IMPLANT

## 2015-04-09 NOTE — Transfer of Care (Signed)
Immediate Anesthesia Transfer of Care Note  Patient: [name]  Procedure(s) Performed: Procedure(s): RIGHT SHOULDER ARTHROSCOPY WITH SUBACROMIAL DECOMPRESSION, DISTAL  CLAVICLE RESECTION LABRAL AND ROTATOR CUFF DEBRIDEMENT (Right)  Patient Location: PACU  Anesthesia Type:General  Level of Consciousness: awake  Airway & Oxygen Therapy: Patient Spontanous Breathing and Patient connected to nasal cannula oxygen  Post-op Assessment: Report given to RN and Post -op Vital signs reviewed and stable  Post vital signs: Reviewed and stable  Last Vitals:  Filed Vitals:   04/09/15 0950  BP: 138/49  Pulse: 80  Temp:   Resp: 19    Complications: No apparent anesthesia complications

## 2015-04-09 NOTE — Discharge Instructions (Signed)
MUST USE CPAP MACHINE FOR SLEEP!!!!    Metta Clines. Supple, M.D., F.A.A.O.S. [facility]opa[facility] Specializing in Arthroscopic and Reconstructive Surgery of the Shoulder and Knee [id number] [address] [place],  [address] - Fax [phone]   POST-OP SHOULDER ARTHROSCOPY INSTRUCTIONS  1. Call the office at [phone] to schedule your first post-op appointment 7-10 days from the date of your surgery.  2. Leave the steri-strips in place over your incisions when performing dressing changes and showering. You may remove your dressings and begin showering 72 hours from surgery. You can expect drainage that is clear to bloody in nature that occasionally will soak through your dressings. If this occurs go ahead and perform a dressing change. The drainage should lessen daily and when there is no drainage from your incisions feel free to go without a dressing.  3. Wear your sling for comfort. You may come out of your sling for ad lib activity and even decide not to use the sling at all. If you find you are more comfortable in your sling, make sure you come out of your sling at least 3-4 times a day to do the exercises that are included below.  4. Range of motion to your elbow, wrist, and hand are encouraged 3-5 times daily. Exercise to your hand and fingers helps to reduce swelling you may experience.  5. Utilize ice to the shoulder 3-4 times minimum a day and additionally if you are experiencing pain.  6. You may drive when safely off narcotics and muscle relaxants.  7. If you had a block pre-operatively to provide post-op pain relief you may want to go ahead and begin utilizing your pain meds as your arm begins to wake up. Blocks can sometimes last up to 16-18 hours. If you are still pain-free prior to going to bed you may want to strongly consider taking a pain medication to avoid being awakened in the night with the onset of pain. A muscle relaxant is also provided for you  should you experience muscle spasms. It is recommended that if you are experiencing pain that your pain medication alone is not controlling, add the muscle relaxant along with the pain medication which can give additional pain relief. The first one to two days is generally the most severe of your pain and then should gradually decrease. As your pain lessens it is recommended that you decrease your use of the pain medications to an "as needed basis" only and to always comply with the recommended dosages of the pain medications.  8. Pain medications can produce constipation along with their use. If you experience this, the use of an over the counter stool softener or laxative daily is recommended.   9. For additional questions or concerns, please do not hesitate to call the office. If after hours there is an answering service to forward your concerns to the physician on call.   POST-OP EXERCISES  The pendulum exercises should be performed while bending at the waist as far over as possible thereby letting gravity do the work for you.  Range of Motion Exercises: Pendulum (circular)  Repeat 20 times. Do 3 sessions per day.     Range of Motion Exercises: Pendulum (side-to-side)  Repeat 20 times. Do 3 sessions per day.    Range of Motion Exercises (self-stretching activities):  Slide arm up wall with palm toward you, moving closer to the wall. Hold for 5 seconds.  Repeat 10 times. Do 3 sessions per day.

## 2015-04-09 NOTE — Anesthesia Procedure Notes (Addendum)
Anesthesia Regional Block:  Interscalene brachial plexus block  Pre-Anesthetic Checklist: ,, timeout performed, Correct Patient, Correct Site, Correct Laterality, Correct Procedure, Correct Position, site marked, Risks and benefits discussed,  Surgical consent,  Pre-op evaluation,  At surgeon's request and post-op pain management  Laterality: Right  Prep: chloraprep       Needles:  Injection technique: Single-shot  Needle Type: Echogenic Stimulator Needle     Needle Length: 9cm 9 cm Needle Gauge: 21 and 21 G    Additional Needles:  Procedures: ultrasound guided (picture in chart) and nerve stimulator Interscalene brachial plexus block  Nerve Stimulator or Paresthesia:  Response: deltoid, 0.45 mA,   Additional Responses:   Narrative:  Start time: 04/09/2015 9:25 AM End time: 04/09/2015 9:35 AM Injection made incrementally with aspirations every 5 mL.  Performed by: Personally  Anesthesiologist: Suzette Battiest  Additional Notes: Risks and benefits discussed. Pt tolerated well with no immediate complications.   Procedure Name: Intubation Date/Time: 04/09/2015 10:14 AM Performed by: Manus Gunning, Datron Brakebill J Pre-anesthesia Checklist: Patient identified, Emergency Drugs available, Suction available, Patient being monitored and Timeout performed Patient Re-evaluated:Patient Re-evaluated prior to inductionOxygen Delivery Method: Circle system utilized Preoxygenation: Pre-oxygenation with 100% oxygen Intubation Type: IV induction Ventilation: Mask ventilation without difficulty Laryngoscope Size: Mac and 4 Grade View: Grade I Tube type: Oral Tube size: 8.0 mm Number of attempts: 1 Placement Confirmation: ETT inserted through vocal cords under direct vision,  positive ETCO2 and breath sounds checked- equal and bilateral Secured at: 23 cm Tube secured with: Tape Dental Injury: Teeth and Oropharynx as per pre-operative assessment

## 2015-04-09 NOTE — H&P (Signed)
Sharon Mt Kindel    Chief Complaint: RIGHT SHOULDER IMPINGEMENT  HPI: The patient is a 44 y.o. male with chronic right shoulder pain refractory to conservative managment  Past Medical History  Diagnosis Date  . Sleep apnea     cpap 1 yr  . GERD (gastroesophageal reflux disease)     occ  . Arthritis     Past Surgical History  Procedure Laterality Date  . Knee surgery Left     arthroscopy  . Shoulder surgery Left     arthroscopy    History reviewed. No pertinent family history.  Social History:  reports that he quit smoking about 10 years ago. His smoking use included Cigarettes. He has a 1.25 pack-year smoking history. He does not have any smokeless tobacco history on file. He reports that he does not drink alcohol or use illicit drugs.  Allergies: No Known Allergies  No prescriptions prior to admission     Physical Exam: right shoulder with painful and restricted motion as noted at recent office visits  Vitals  Temp:  [99.1 F (37.3 C)] 99.1 F (37.3 C) (06/02 0829) Pulse Rate:  [64] 64 (06/02 0829) Resp:  [18] 18 (06/02 0829) BP: (133)/(57) 133/57 mmHg (06/02 0829) SpO2:  [97 %] 97 % (06/02 0829) Weight:  [141.522 kg (312 lb)] 141.522 kg (312 lb) (06/02 0829)  Assessment/Plan  Impression: RIGHT SHOULDER IMPINGEMENT   Plan of Action: Procedure(s): RIGHT SHOULDER ARTHROSCOPY WITH SUBACROMIAL DECOMPRESSION, DISTAL  CLAVICLE RESECTION POSSIBLE BICEP TENODESIS   Joshlyn Beadle M 04/09/2015, 9:25 AM Contact # [phone]

## 2015-04-09 NOTE — Anesthesia Postprocedure Evaluation (Signed)
  Anesthesia Post-op Note  Patient: [name]  Procedure(s) Performed: Procedure(s): RIGHT SHOULDER ARTHROSCOPY WITH SUBACROMIAL DECOMPRESSION, DISTAL  CLAVICLE RESECTION LABRAL AND ROTATOR CUFF DEBRIDEMENT (Right)  Patient Location: PACU  Anesthesia Type:GA combined with regional for post-op pain  Level of Consciousness: awake, alert  and oriented  Airway and Oxygen Therapy: Patient Spontanous Breathing  Post-op Pain: none  Post-op Assessment: Post-op Vital signs reviewed  Post-op Vital Signs: Reviewed  Last Vitals:  Filed Vitals:   04/09/15 1226  BP: 125/80  Pulse: 64  Temp:   Resp: 22    Complications: No apparent anesthesia complications

## 2015-04-09 NOTE — Anesthesia Preprocedure Evaluation (Addendum)
Anesthesia Evaluation  Patient identified by MRN, date of birth, ID band Patient awake    Reviewed: Allergy & Precautions, NPO status , Patient's Chart, lab work & pertinent test results  Airway Mallampati: II  TM Distance: >3 FB Neck ROM: Full    Dental   Pulmonary sleep apnea , former smoker,  breath sounds clear to auscultation        Cardiovascular negative cardio ROS  Rhythm:Regular Rate:Normal     Neuro/Psych negative neurological ROS     GI/Hepatic Neg liver ROS, GERD-  ,  Endo/Other  Morbid obesity  Renal/GU negative Renal ROS     Musculoskeletal  (+) Arthritis -,   Abdominal   Peds  Hematology negative hematology ROS (+)   Anesthesia Other Findings   Reproductive/Obstetrics                            Anesthesia Physical Anesthesia Plan  ASA: II  Anesthesia Plan: General and Regional   Post-op Pain Management:    Induction: Intravenous  Airway Management Planned: Oral ETT  Additional Equipment:   Intra-op Plan:   Post-operative Plan: Extubation in OR  Informed Consent: I have reviewed the patients History and Physical, chart, labs and discussed the procedure including the risks, benefits and alternatives for the proposed anesthesia with the patient or authorized representative who has indicated his/her understanding and acceptance.   Dental advisory given  Plan Discussed with: CRNA  Anesthesia Plan Comments:         Anesthesia Quick Evaluation

## 2015-04-09 NOTE — Op Note (Signed)
04/09/2015  11:27 AM  PATIENT:   Sharon Mt Boshers  44 y.o. male  PRE-OPERATIVE DIAGNOSIS:  RIGHT SHOULDER IMPINGEMENT , AC joint OA  POST-OPERATIVE DIAGNOSIS:  Same with labral tear and partial articular rotator cuff tear  PROCEDURE:  RSA, labral and rotator cuff debridement, SAD, DCR  SURGEON:  Navarro Nine, Metta Clines M.D.  ASSISTANTS: Shuford pac   ANESTHESIA:   GET + ISB  EBL: minimal  SPECIMEN:  none  Drains: none   PATIENT DISPOSITION:  PACU - hemodynamically stable.    PLAN OF CARE: Discharge to home after PACU  Dictation# [id number]   Contact # [phone]

## 2015-04-10 ENCOUNTER — Encounter (HOSPITAL_COMMUNITY): Payer: Self-pay | Admitting: Orthopedic Surgery

## 2015-04-10 NOTE — Op Note (Signed)
[name], [name]          ACCOUNT NO.:  [id number]  MEDICAL RECORD NO.:  [id number]  LOCATION:  [facility]                         FACILITY:  [facility]  PHYSICIAN:  Metta Clines. Ayano Douthitt, M.D.  DATE OF BIRTH:  18-Dec-1970  DATE OF PROCEDURE:  04/09/2015 DATE OF DISCHARGE:  04/09/2015                              OPERATIVE REPORT   PREOPERATIVE DIAGNOSES: 1. Chronic right shoulder pain with impingement syndrome. 2. Right shoulder symptomatic acromioclavicular joint arthropathy.  POSTOPERATIVE DIAGNOSES: 1. Chronic right shoulder pain with impingement syndrome. 2. Right shoulder symptomatic acromioclavicular joint arthropathy. 3. Partial articular rotator cuff tear. 4. Degenerative labral tear.  PROCEDURES: 1. Right shoulder examination under anesthesia. 2. Right shoulder glenohumeral joint diagnostic arthroscopy. 3. Debridement of extensive degenerative labral tear. 4. Debridement of partial articular rotator cuff tear. 5. Arthroscopic subacromial decompression and bursectomy. 6. Arthroscopic distal clavicle resection.  SURGEON:  Metta Clines. Shayden Bobier, M.D.  Terrence DupontOlivia Mackie A. Shuford, PA-C.  ANESTHESIA:  General endotracheal as well as an interscalene block.  ESTIMATED BLOOD LOSS:  None.  DRAINS:  None.  HISTORY:  Mr. [name] is a 44 year old gentleman who has had persistent right shoulder pain after an original injury while weightlifting. Examination showing tenderness about the anterior aspect of the shoulder, painful arc of motion, excellent strength.  His preoperative MRI scan shows rotator cuff tendinosis and AC joint arthropathy, but no obvious discrete full-thickness rotator cuff tear and did not appreciate any obvious gross evidence for biceps tendinopathy, SLAP tear or instability.  Due to his ongoing pain, functional limitations and failure to respond to conservative management, he was brought to the operating room at this time for planned right shoulder arthroscopy  as described below.  Preoperatively, I counseled Mr. [name] regarding treatment options and potential risks versus benefits thereof.  Possible surgical complications were all reviewed including potential for bleeding, infection, neurovascular injury, persistent pain, loss of motion, anesthetic complication, and possible need for additional surgery.  He understands and accepts and agrees with the planned procedure.  PROCEDURE IN DETAIL:  After undergoing routine preop evaluation, the patient received prophylactic antibiotics and an interscalene block was established in the holding area by the Anesthesia Department.  Placed supine on the operating table, underwent smooth induction of a general endotracheal anesthesia.  Placed into the left lateral decubitus position on a beanbag and appropriately padded and protected.  Right shoulder examination under anesthesia revealed full motion and no obvious instability patterns were noted.  Right arm was then suspended at approximately 45 degrees of abduction with 15 pounds of traction and the right shoulder girdle region was then sterilely prepped and draped in standard fashion.  Time-out was called.  A posterior portal was established into the glenohumeral joint and anterior portal was established under direct visualization.  Articular surfaces were all found to be in good condition.  No instability patterns were noted. There was extensive degenerative tearing of the anterior and superior labrum, which was debrided with a shaver.  Biceps anchor was stable and biceps tendon itself was very carefully inspected and probed, and found no evidence to suggest distal instability, although there was some fraying of the rotator cuff adjacent to the entrance into the bicipital groove, some partial articular-sided  tearing in this region, which we debrided, but again carefully probed and applied tension to the long- head biceps tendon, did not see any  evidence to suggest that there was a defect in the subscapularis creating any type of instability.  Rotator cuff involving the distal supraspinatus adjacent regions were all found to be intact and pristine.  At this point, fluid and instruments were removed from the glenohumeral joint.  The arm was dropped down to 30 degrees of abduction with the arthroscope introduced in the subacromial space of the posterior portal and a direct lateral portal was established in the subacromial space.  Abundant dense bursal tissue and multiple adhesions were encountered, and these were all divided and excised with a combination of shaver and the Stryker wand.  The wand was then used to remove the periosteum from the undersurface of the anterior half of the acromion and then a subacromial decompression was performed with a burr, creating a type 1 morphology.  Of note, there was very thickened, dense proliferative bursal tissue throughout the subacromial/subdeltoid bursal region.  Upon completion of the bursectomy and the subacromial decompression, established portal directly anterior to the distal clavicle and distal clavicle resection was performed with a bur.  Care was taken to confirm visualization of the entire circumference of the distal clavicle to ensure adequate removal of bone. Approximately 1 cm bone was excised.  At this point, we then completed the subacromial/subdeltoid bursectomy.  The bursal surface of the rotator cuff was carefully inspected and found to be intact.  Final hemostasis was obtained.  Fluid and instruments were removed.  The portals were closed with Monocryl.  A dry dressing taped at the right shoulder.  Right arm was placed in a sling.  The patient was awakened, extubated, and taken to the recovery room in stable condition.  [name], PA-C, was used as an Environmental consultant throughout this case, essential for help with positioning of the patient, positioning of the extremity  due to his massive body habitus, weight over 300 pounds, management of the arthroscopic equipment, wound closure, and intraoperative decision making.     Metta Clines. Grahm Etsitty, M.D.     KMS/MEDQ  D:  04/09/2015  T:  04/10/2015  Job:  [id number]

## 2015-08-25 ENCOUNTER — Encounter: Payer: Self-pay | Admitting: Neurology

## 2015-08-25 ENCOUNTER — Ambulatory Visit (INDEPENDENT_AMBULATORY_CARE_PROVIDER_SITE_OTHER): Payer: Commercial Managed Care - HMO | Admitting: Neurology

## 2015-08-25 VITALS — BP 131/73 | HR 63 | Ht 76.5 in | Wt 312.0 lb

## 2015-08-25 DIAGNOSIS — M5416 Radiculopathy, lumbar region: Secondary | ICD-10-CM | POA: Diagnosis not present

## 2015-08-25 DIAGNOSIS — M5412 Radiculopathy, cervical region: Secondary | ICD-10-CM

## 2015-08-25 MED ORDER — OXCARBAZEPINE 150 MG PO TABS: 150.0000 mg | ORAL_TABLET | Freq: Two times a day (BID) | ORAL | Status: DC

## 2015-08-25 NOTE — Progress Notes (Addendum)
PATIENT: [name] DOB: 1971-09-14  Chief Complaint  Patient presents with  . Neck Pain    He has mild neck pain and stiffness. Reports tingling and numbness in her left arm and hand that has been present for months.  He also has a persistent itch on his left hand.  He has tried and failed gabapentin.  . Back Pain    He has a history of back pain.  He has had three epidural steroid injections to help manage this pain.     HISTORICAL  [name] is a 44 year old right-handed male, seen in refer by his primary care physician Dr. Lona Kettle for evaluation of neck pain, low back pain  He had a history of chronic neck, and low back pain, getting worse since 2015, he works at [employer] job  Since 2015, he noticed worsening neck pain, radiating pain to his left shoulder, left lateral arm, involving left index, and thumb, almost constant numbness tingling, itching sensation, he denies significant weakness, he has tried physical therapy, gabapentin, Lyrica without helping his symptoms,  He also has intermittent low back pain, worsening on the right side, radiating pain to right leg, sometimes difficulty raising up right leg due to the catching sensation at his lower back  He denies significant gait difficulty, no bowel and bladder incontinence,  I have personally reviewed MRI of cervical spine in April 2016, there was evidence of congenital mild canal narrowing, progressive moderate left foraminal stenosis at C3-4, C4-5, C5-6 level, mild right foraminal narrowing at this levels is stable, a symmetric left side uncovertebral and facet disease at C7-T1  MRI of lumbar spine, multilevel mild degenerative disc disease, no significant foraminal or canal stenosis.  REVIEW OF SYSTEMS: Full 14 system review of systems performed and notable only for fatigue, shortness of breath, increased thirst, numbness, insomnia, not enough sleep, decreased energy, disinteresting  activities  ALLERGIES: No Known Allergies  HOME MEDICATIONS: Current Outpatient Prescriptions  Medication Sig Dispense Refill  . Multiple Vitamins-Minerals (MULTIVITAMIN WITH MINERALS) tablet Take 3 tablets by mouth daily.     . Omega-3 Fatty Acids (OMEGA 3 PO) Take 1 capsule by mouth daily.     Marland Kitchen zolpidem (AMBIEN) 10 MG tablet at bedtime as needed.      No current facility-administered medications for this visit.    PAST MEDICAL HISTORY: Past Medical History  Diagnosis Date  . Sleep apnea     cpap 1 yr  . GERD (gastroesophageal reflux disease)     occ  . Arthritis   . Migraine   . Back pain   . Neck pain   . Numbness and tingling in left arm   . Numbness and tingling in left hand     PAST SURGICAL HISTORY: Past Surgical History  Procedure Laterality Date  . Knee surgery Left     arthroscopy  . Shoulder surgery Left     arthroscopy  . Shoulder arthroscopy with subacromial decompression, rotator cuff repair and bicep tendon repair Right 04/09/2015    Procedure: RIGHT SHOULDER ARTHROSCOPY WITH SUBACROMIAL DECOMPRESSION, DISTAL  CLAVICLE RESECTION LABRAL AND ROTATOR CUFF DEBRIDEMENT;  Surgeon: Justice Britain, MD;  Location: [facility];  Service: Orthopedics;  Laterality: Right;  . Epidural steroid injections      FAMILY HISTORY: Family History  Problem Relation Age of Onset  . Hypertension Mother   . Hypertension Father   . Ovarian cancer Mother     SOCIAL HISTORY:  Social History  Social History  . Marital Status: Married    Spouse Name: N/A  . Number of Children: 2  . Years of Education: College    Occupational History  . General Floor Help - [employer]    Social History Main Topics  . Smoking status: Former Smoker -- 0.25 packs/day for 5 years    Types: Cigarettes    Quit date: 03/29/2005  . Smokeless tobacco: Not on file  . Alcohol Use: 0.0 oz/week    0 Standard drinks or equivalent per week     Comment: Social use - rare  . Drug Use: No  . Sexual  Activity: Not on file   Other Topics Concern  . Not on file   Social History Narrative   Lives with wife and two children.   Right-handed.   Occasional use of caffeine.     PHYSICAL EXAM   Filed Vitals:   08/25/15 1433  BP: 131/73  Pulse: 63  Height: 6' 4.5" (1.943 m)  Weight: 312 lb (141.522 kg)    Not recorded      Body mass index is 37.49 kg/(m^2).  PHYSICAL EXAMNIATION:  Gen: NAD, conversant, well nourised, obese, well groomed                     Cardiovascular: Regular rate rhythm, no peripheral edema, warm, nontender. Eyes: Conjunctivae clear without exudates or hemorrhage Neck: Supple, no carotid bruise. Pulmonary: Clear to auscultation bilaterally   NEUROLOGICAL EXAM:  MENTAL STATUS: Speech:    Speech is normal; fluent and spontaneous with normal comprehension.  Cognition:     Orientation to time, place and person     Normal recent and remote memory     Normal Attention span and concentration     Normal Language, naming, repeating,spontaneous speech     Fund of knowledge   CRANIAL NERVES: CN II: Visual fields are full to confrontation. Fundoscopic exam is normal with sharp discs and no vascular changes. Pupils are round equal and briskly reactive to light. CN III, IV, VI: extraocular movement are normal. No ptosis. CN V: Facial sensation is intact to pinprick in all 3 divisions bilaterally. Corneal responses are intact.  CN VII: Face is symmetric with normal eye closure and smile. CN VIII: Hearing is normal to rubbing fingers CN IX, X: Palate elevates symmetrically. Phonation is normal. CN XI: Head turning and shoulder shrug are intact CN XII: Tongue is midline with normal movements and no atrophy.  MOTOR: There is no pronator drift of out-stretched arms. Muscle bulk and tone are normal. Muscle strength is normal.  REFLEXES: Reflexes are 2+ and symmetric at the biceps, triceps, knees, and ankles. Plantar responses are flexor.  SENSORY: Intact to  light touch, pinprick, position sense, and vibration sense are intact in fingers and toes.  COORDINATION: Rapid alternating movements and fine finger movements are intact. There is no dysmetria on finger-to-nose and heel-knee-shin.    GAIT/STANCE: Posture is normal. Gait is steady with normal steps, base, arm swing, and turning. Heel and toe walking are normal. Tandem gait is normal.  Romberg is absent.   DIAGNOSTIC DATA (LABS, IMAGING, TESTING) - I reviewed patient records, labs, notes, testing and imaging myself where available.   ASSESSMENT AND PLAN  [name] is a 44 y.o. male    Left cervical radiculopathy  EMG nerve conduction study  He has tried and failed gabapentin, Lyrica, NSAIDs, physical therapy, epidural injection by Dr. Ron Agee  I have started Trileptal 150 mg  2 times a day,  May consider cervical decompression surgery if he continue have significant pain, failed medication treatment   Marcial Pacas, M.D. Ph.D.  [facility] [address], [place], [place] [address] Ph: [phone] Fax: [phone]  CC: Lona Kettle, MD

## 2015-08-26 ENCOUNTER — Telehealth: Payer: Self-pay | Admitting: *Deleted

## 2015-08-26 NOTE — Telephone Encounter (Signed)
Gerald Stabs called back and confirmed his appointment.

## 2015-08-26 NOTE — Telephone Encounter (Addendum)
Called to offer patient a work-in appt for 08/31/15 for his EMG/NCV test. He would need to arrive to our office at 11:45am and there may be a slight delay for Dr. Rhea Belton portion of the test.  I have booked the appt to hold the slot and left him a message for a return call to confirm it the time works for him.

## 2015-08-31 ENCOUNTER — Ambulatory Visit (INDEPENDENT_AMBULATORY_CARE_PROVIDER_SITE_OTHER): Payer: Commercial Managed Care - HMO | Admitting: Neurology

## 2015-08-31 DIAGNOSIS — M5412 Radiculopathy, cervical region: Secondary | ICD-10-CM | POA: Insufficient documentation

## 2015-08-31 DIAGNOSIS — M5010 Cervical disc disorder with radiculopathy, unspecified cervical region: Secondary | ICD-10-CM

## 2015-08-31 DIAGNOSIS — M5416 Radiculopathy, lumbar region: Secondary | ICD-10-CM

## 2015-08-31 NOTE — Progress Notes (Signed)
PATIENT: [name] DOB: 12-Jul-1971  No chief complaint on file.    HISTORICAL  [name] is a 44 year old right-handed male, seen in refer by his primary care physician Dr. Lona Kettle for evaluation of neck pain, low back pain  He had a history of chronic neck, and low back pain, getting worse since 2015, he works at [employer] job  Since 2015, he noticed worsening neck pain, radiating pain to his left shoulder, left lateral arm, involving left index, and thumb, almost constant numbness tingling, itching sensation, he denies significant weakness, he has tried physical therapy, gabapentin, Lyrica without helping his symptoms,  He also has intermittent low back pain, worsening on the right side, radiating pain to right leg, sometimes difficulty raising up right leg due to the catching sensation at his lower back  He denies significant gait difficulty, no bowel and bladder incontinence,  I have personally reviewed MRI of cervical spine in April 2016, there was evidence of congenital mild canal narrowing, progressive moderate left foraminal stenosis at C3-4, C4-5, C5-6 level, mild right foraminal narrowing at this levels is stable, a symmetric left side uncovertebral and facet disease at C7-T1  MRI of lumbar spine, multilevel mild degenerative disc disease, no significant foraminal or canal stenosis.  Update August 31 2015: He continue has almost constant moderate degree left-sided neck pain, radiating paresthesia to left lateral arm, left index and thumb Today's electrodiagnostic study confirmed mild chronic left C6-7 cervical radiculopathy,  We have reviewed MRI of cervical spine, evidence of congenital mild canal narrowing, multilevel degenerative disc disease, most severe at C5-6, potential right foraminal stenosis.  REVIEW OF SYSTEMS: Full 14 system review of systems performed and notable only for fatigue, shortness of breath, increased thirst,  numbness, insomnia, not enough sleep, decreased energy, disinteresting activities  ALLERGIES: No Known Allergies  HOME MEDICATIONS: Current Outpatient Prescriptions  Medication Sig Dispense Refill  . Multiple Vitamins-Minerals (MULTIVITAMIN WITH MINERALS) tablet Take 3 tablets by mouth daily.     . Omega-3 Fatty Acids (OMEGA 3 PO) Take 1 capsule by mouth daily.     . OXcarbazepine (TRILEPTAL) 150 MG tablet Take 1 tablet (150 mg total) by mouth 2 (two) times daily. 60 tablet 3  . zolpidem (AMBIEN) 10 MG tablet at bedtime as needed.      No current facility-administered medications for this visit.    PAST MEDICAL HISTORY: Past Medical History  Diagnosis Date  . Sleep apnea     cpap 1 yr  . GERD (gastroesophageal reflux disease)     occ  . Arthritis   . Migraine   . Back pain   . Neck pain   . Numbness and tingling in left arm   . Numbness and tingling in left hand     PAST SURGICAL HISTORY: Past Surgical History  Procedure Laterality Date  . Knee surgery Left     arthroscopy  . Shoulder surgery Left     arthroscopy  . Shoulder arthroscopy with subacromial decompression, rotator cuff repair and bicep tendon repair Right 04/09/2015    Procedure: RIGHT SHOULDER ARTHROSCOPY WITH SUBACROMIAL DECOMPRESSION, DISTAL  CLAVICLE RESECTION LABRAL AND ROTATOR CUFF DEBRIDEMENT;  Surgeon: Justice Britain, MD;  Location: [facility];  Service: Orthopedics;  Laterality: Right;  . Epidural steroid injections      FAMILY HISTORY: Family History  Problem Relation Age of Onset  . Hypertension Mother   . Hypertension Father   . Ovarian cancer Mother     SOCIAL  HISTORY:  Social History   Social History  . Marital Status: Married    Spouse Name: N/A  . Number of Children: 2  . Years of Education: College    Occupational History  . General Floor Help - [employer]    Social History Main Topics  . Smoking status: Former Smoker -- 0.25 packs/day for 5 years    Types: Cigarettes    Quit date:  03/29/2005  . Smokeless tobacco: Not on file  . Alcohol Use: 0.0 oz/week    0 Standard drinks or equivalent per week     Comment: Social use - rare  . Drug Use: No  . Sexual Activity: Not on file   Other Topics Concern  . Not on file   Social History Narrative   Lives with wife and two children.   Right-handed.   Occasional use of caffeine.     PHYSICAL EXAM   There were no vitals filed for this visit.  Not recorded      There is no weight on file to calculate BMI.  PHYSICAL EXAMNIATION:  Gen: NAD, conversant, well nourised, obese, well groomed                     Cardiovascular: Regular rate rhythm, no peripheral edema, warm, nontender. Eyes: Conjunctivae clear without exudates or hemorrhage Neck: Supple, no carotid bruise. Pulmonary: Clear to auscultation bilaterally   NEUROLOGICAL EXAM:  MENTAL STATUS: Speech:    Speech is normal; fluent and spontaneous with normal comprehension.  Cognition:     Orientation to time, place and person     Normal recent and remote memory     Normal Attention span and concentration     Normal Language, naming, repeating,spontaneous speech     Fund of knowledge   CRANIAL NERVES: CN II: Visual fields are full to confrontation. Pupils are round equal and briskly reactive to light. CN III, IV, VI: extraocular movement are normal. No ptosis. CN V: Facial sensation is intact to pinprick in all 3 divisions bilaterally. Corneal responses are intact.  CN VII: Face is symmetric with normal eye closure and smile. CN VIII: Hearing is normal to rubbing fingers CN IX, X: Palate elevates symmetrically. Phonation is normal. CN XI: Head turning and shoulder shrug are intact CN XII: Tongue is midline with normal movements and no atrophy.  MOTOR: There is no pronator drift of out-stretched arms. Muscle bulk and tone are normal, he has slight left shoulder abduction, external rotation weakness   REFLEXES: Reflexes are 2+ and symmetric at the  biceps, triceps, knees, and ankles. Plantar responses are flexor.  SENSORY: Intact to light touch, pinprick, position sense, and vibration sense are intact in fingers and toes.  COORDINATION: Rapid alternating movements and fine finger movements are intact. There is no dysmetria on finger-to-nose and heel-knee-shin.    GAIT/STANCE: Posture is normal. Gait is steady with normal steps, base, arm swing, and turning. Heel and toe walking are normal. Tandem gait is normal.  Romberg is absent.   DIAGNOSTIC DATA (LABS, IMAGING, TESTING) - I reviewed patient records, labs, notes, testing and imaging myself where available.   ASSESSMENT AND PLAN  JOAHAN SWATZELL is a 44 y.o. male    Left cervical radiculopathy  Which is confirmed by today's electrodiagnostic study, chronic left C 6 and 7 radiculopathy  Keep Trileptal 150 mg twice a day  Referred to neurosurgeon for possible cervical decompression  Marcial Pacas, M.D. Ph.D.  [facility] [phone] [address]  [address], [place], [place] [address] Ph: [phone] Fax: [phone]  CC: Lona Kettle, MD

## 2015-08-31 NOTE — Procedures (Signed)
   NCS (NERVE CONDUCTION STUDY) WITH EMG (ELECTROMYOGRAPHY) REPORT   STUDY DATE: August 31 2015 PATIENT NAME: [name] DOB: [birth year] MRN: [id number]    TECHNOLOGIST: Laretta Alstrom ELECTROMYOGRAPHER: Marcial Pacas M.D.  CLINICAL INFORMATION:  44 year old right-handed male complains of left-sided neck pain, radiating paresthesia along left lateral arm, involving left dorsal thumb, index finger.  FINDINGS: NERVE CONDUCTION STUDY: Bilateral median and ulnar sensory and motor responses were normal. Left peroneal, sural sensory responses were normal. Left peroneal to EDB, and tibial motor responses were normal. Right tibial H reflexes were present.    NEEDLE ELECTROMYOGRAPHY: Selected needle examination was performed at left upper extremity muscles, and left cervical paraspinal muscles.   Needle examination of left pronator teres, biceps, triceps, brachioradialis: Normal insertion activity, no spontaneous activity, mild enlarged complex motor unit potential, with mildly decreased recruitment patterns.  Needle examination of left extensor digitorum communis, first dorsal interossei showed no significant abnormality.  There was no spontaneous activity at left cervical paraspinal muscles, left C5, 6 and 7.   IMPRESSION:   This is an abnormal study. There is electrodiagnostic evidence of mild left cervical radiculopathy, mainly involving left C6 and C7 myotomes.    INTERPRETING PHYSICIAN:   Marcial Pacas M.D. Ph.D. [facility] [address] [place], [place] [address] [phone]

## 2015-10-08 ENCOUNTER — Encounter: Payer: Commercial Managed Care - HMO | Admitting: Neurology

## 2015-12-01 ENCOUNTER — Ambulatory Visit: Payer: Commercial Managed Care - HMO | Admitting: Neurology

## 2015-12-01 ENCOUNTER — Telehealth: Payer: Self-pay | Admitting: *Deleted

## 2015-12-01 NOTE — Telephone Encounter (Signed)
No showed follow up appt. 

## 2015-12-02 ENCOUNTER — Encounter: Payer: Self-pay | Admitting: Neurology

## 2016-05-29 IMAGING — MR MR LUMBAR SPINE W/O CM
4 of 5 series · 27 of 48 positions shown · non-contrast
Comparison: MRI of the lumbar spine 07/14/2011.

CLINICAL DATA: Low back pain and weakness into the right lower
extremity.

EXAM:
MRI LUMBAR SPINE WITHOUT CONTRAST
TECHNIQUE: Multiplanar, multisequence MR imaging of the lumbar spine was
performed. No intravenous contrast was administered.

[Series 3: T2 · sagittal · 4.0mm · 0.55mm/px · 6 of 14 slices shown (1 of 2)]
[im 1/14]
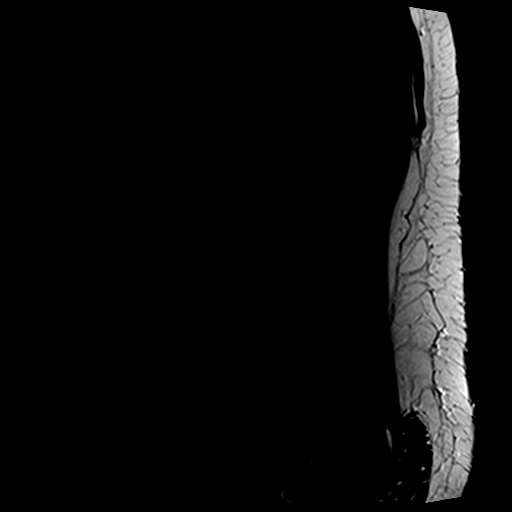
[im 3/14]
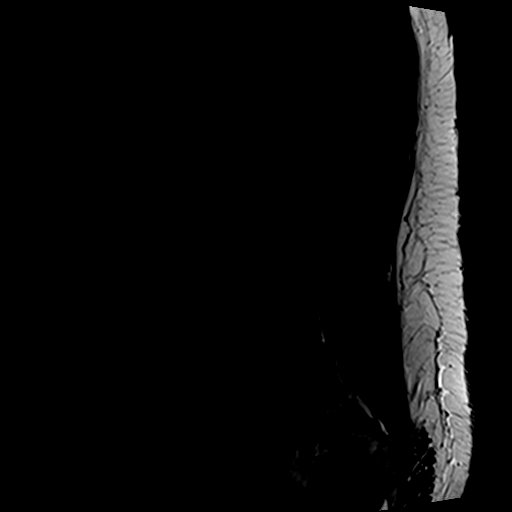
[im 6/14]
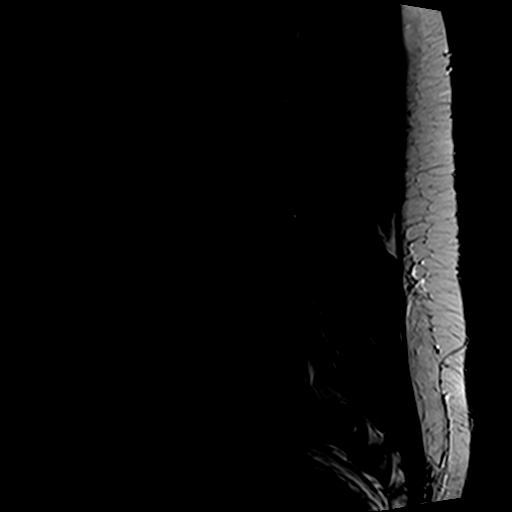
[im 8/14]
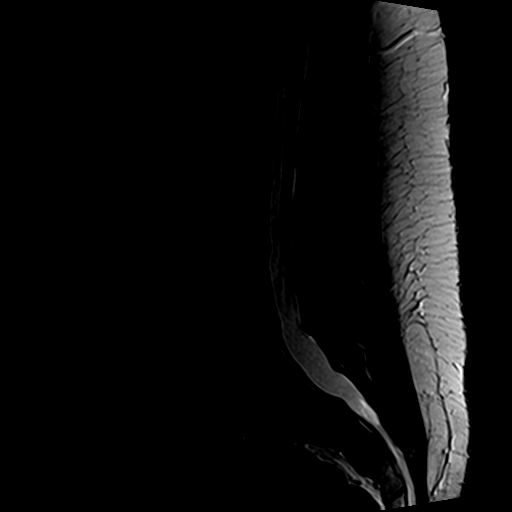
[im 11/14]
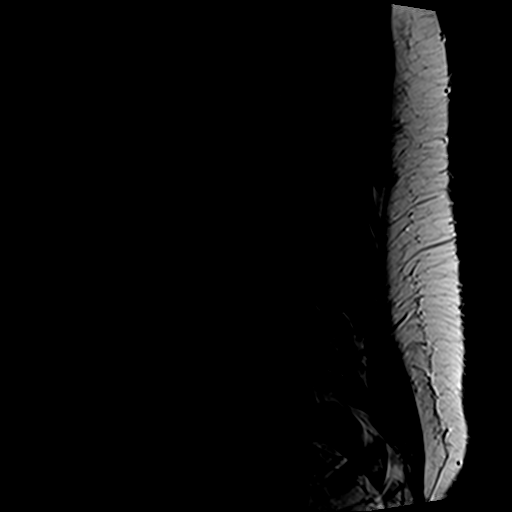
[im 14/14]
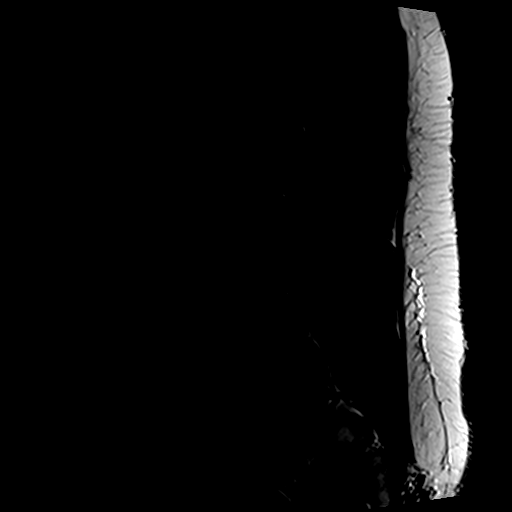

[Series 4: T1 · sagittal · 4.0mm · 0.55mm/px · 5 of 14 slices shown (1 of 2)]
[im 1/14]
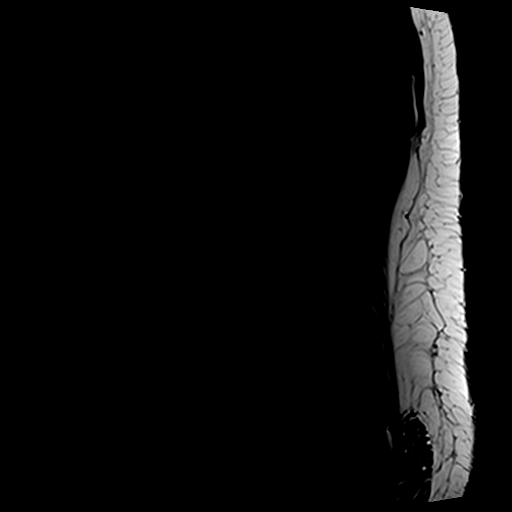
[im 4/14]
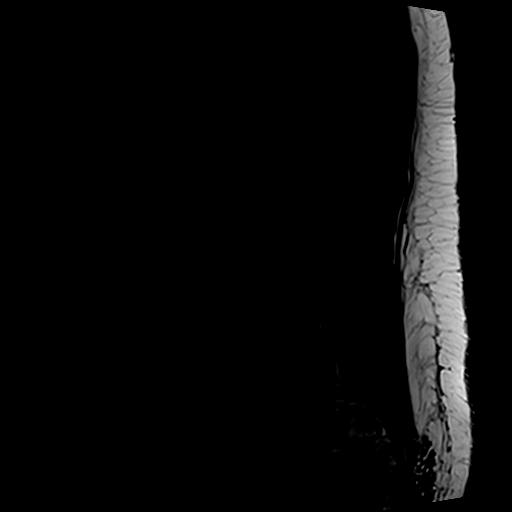
[im 7/14]
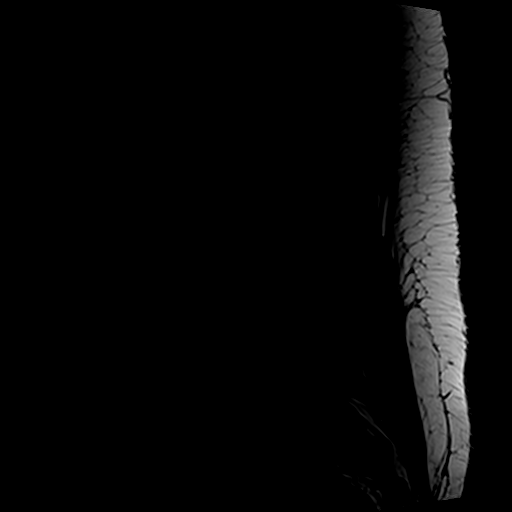
[im 10/14]
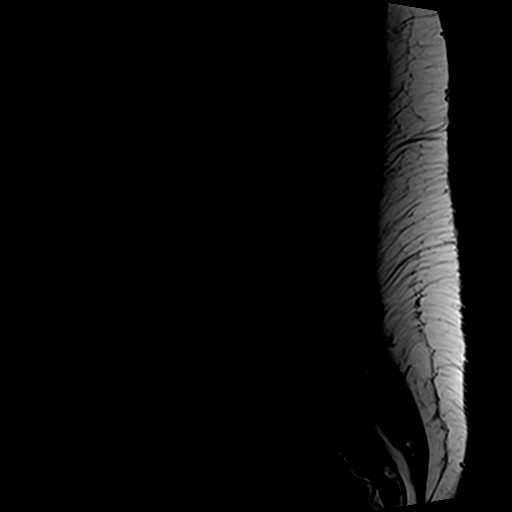
[im 14/14]
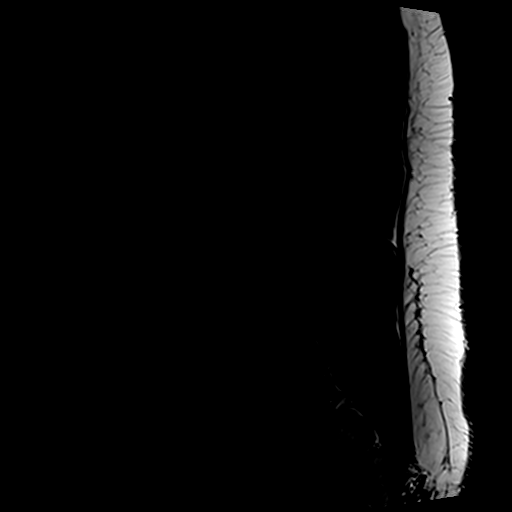

[Series 6: T2 · axial · 4.0mm · 0.70mm/px · z∈[-128,+94]mm · 10 of 46 slices shown (2 of 2)]
[im 4/46]
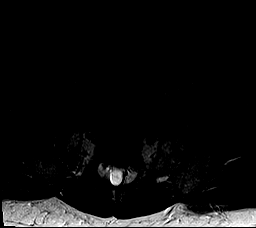
[im 7/46]
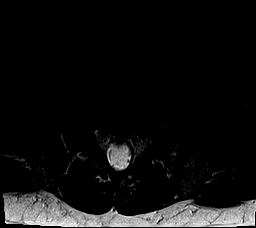
[im 10/46]
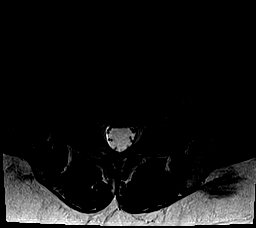
[im 16/46]
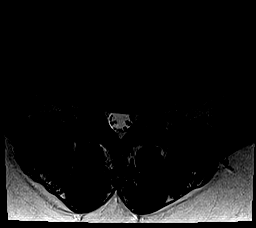
[im 22/46]
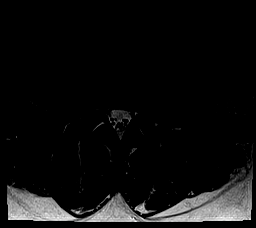
[im 25/46]
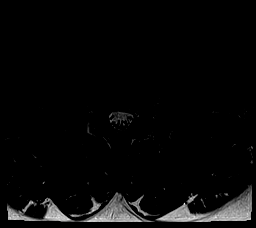
[im 28/46]
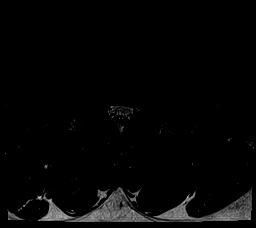
[im 34/46]
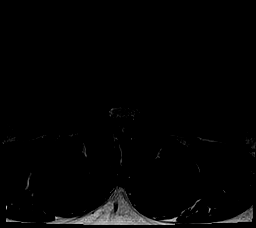
[im 40/46]
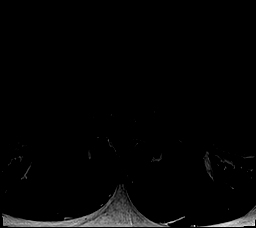
[im 46/46]
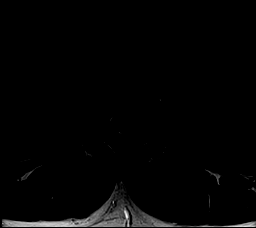

[Series 7: T1 · axial · 4.0mm · 0.35mm/px · z∈[-128,+63]mm · 6 of 46 slices shown (2 of 2)]
[im 4/46]
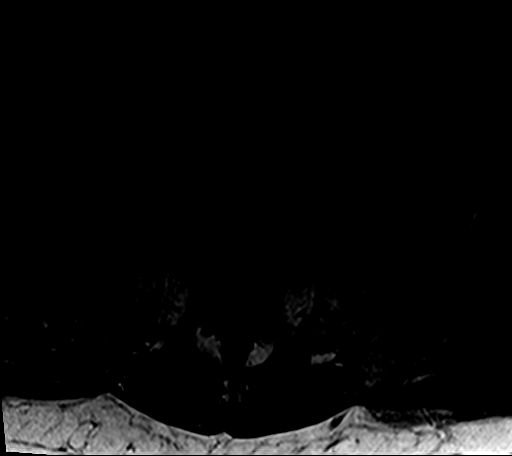
[im 7/46]
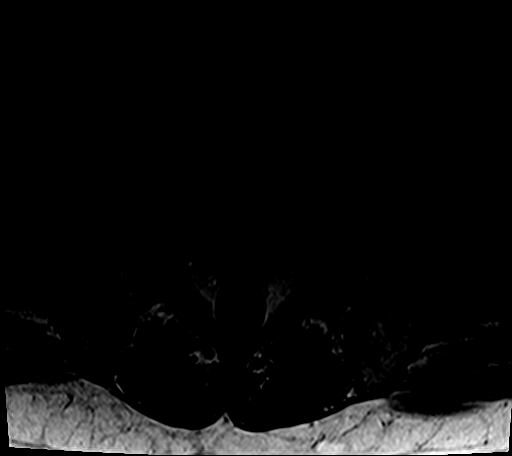
[im 10/46]
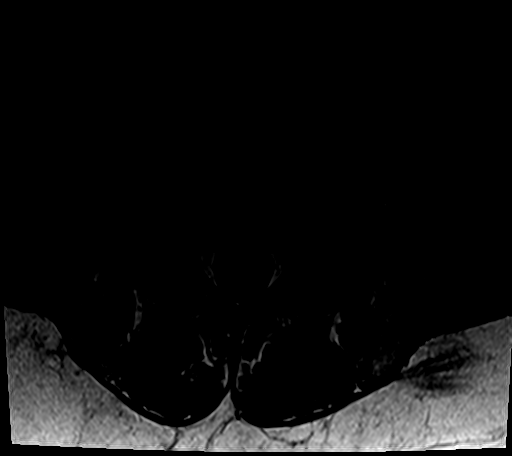
[im 16/46]
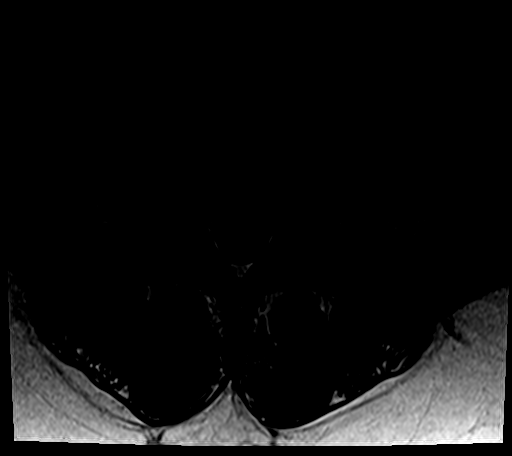
[im 25/46]
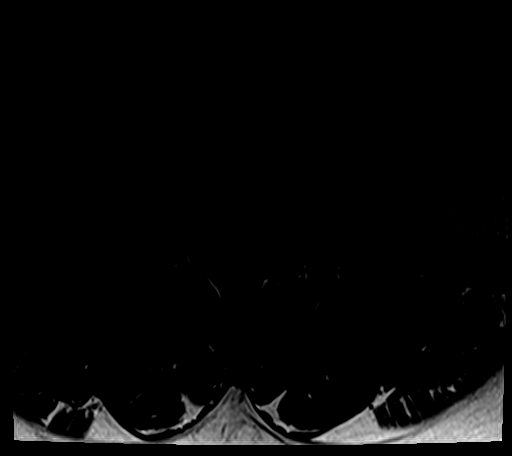
[im 40/46]
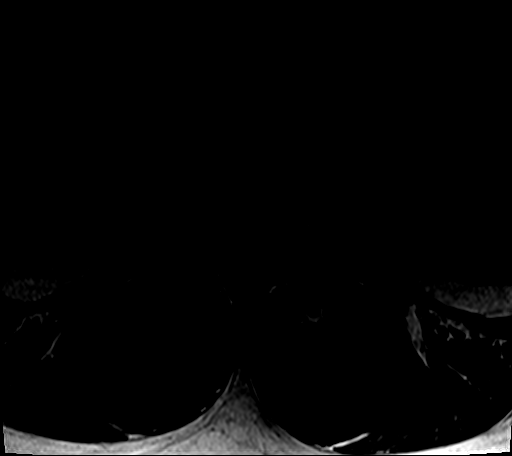

[27 of 48 positions shown; findings below may reference images not displayed]

FINDINGS: Normal signal is present in the conus medullaris which terminates at
L1. Marrow signal scratch the chronic endplate marrow changes are
again noted at L5-S1. Vertebral body heights alignment are
maintained. Limited imaging of the abdomen is unremarkable.

The disc levels at L1-2 and above are normal.

L2-3: Mild disc bulging and facet hypertrophy present. There is
further desiccation of disc signal without significant stenosis.

L3-4: A mild broad-based disc protrusion is present. Moderate facet
hypertrophy is noted bilaterally. Mild foraminal narrowing is
present bilaterally without significant change.

L4-5: A mild leftward disc protrusion is present. A small annular
tear is noted. Mild facet hypertrophy is present bilaterally. Mild
foraminal narrowing bilaterally is stable.

L5-S1: Mild facet hypertrophy and slight disc bulging is stable
bilaterally. The foramina are patent.
IMPRESSION: 1. No significant change in mild foraminal narrowing bilaterally at
L3-4 and L4-5.
2. Mild facet hypertrophy and disc bulging at L5-S1 without
significant stenosis.
3. Progressive signal loss and slight disc bulging at L2-3 without
significant stenosis.

## 2016-05-31 ENCOUNTER — Telehealth: Payer: Self-pay | Admitting: *Deleted

## 2016-05-31 NOTE — Telephone Encounter (Signed)
Pt NCV faxed to to Dr. Leonides Schanz on 05/31/16. Attn Dr. Dietrich Pates [phone]

## 2016-07-07 ENCOUNTER — Encounter: Payer: Self-pay | Admitting: Emergency Medicine

## 2016-07-07 ENCOUNTER — Emergency Department
Admission: EM | Admit: 2016-07-07 | Discharge: 2016-07-07 | Disposition: A | Discharge status: Home / Self Care | Payer: Commercial Managed Care - HMO | Source: Home / Self Care | Attending: Family Medicine | Admitting: Family Medicine

## 2016-07-07 ENCOUNTER — Emergency Department (INDEPENDENT_AMBULATORY_CARE_PROVIDER_SITE_OTHER): Payer: Commercial Managed Care - HMO

## 2016-07-07 DIAGNOSIS — S63617A Unspecified sprain of left little finger, initial encounter: Secondary | ICD-10-CM

## 2016-07-07 DIAGNOSIS — M79642 Pain in left hand: Secondary | ICD-10-CM

## 2016-07-07 NOTE — ED Provider Notes (Signed)
CSN: [id number]     Arrival date & time 07/07/16  1745 History   First MD Initiated Contact with Patient 07/07/16 1802     Chief Complaint  Patient presents with  . Hand Pain   (Consider location/radiation/quality/duration/timing/severity/associated sxs/prior Treatment) HPI [name] is a 45 y.o. male presenting to UC with c/o Left hand pain along his 5th metacarpal and little finger for about 10 days after he was hit in the hand by another player's arm while playing basketball.  Pain is aching and sore but notes it was never swollen and he has been able to bend it well so he did not believe it was broken.  He did want to make sure as pain has not subsided.  Pain is 4/10 and aching.  Pt is Right hand dominant.  He has seen Dr. Fredna Dow, Hand surgeon in the past but that was for finger fractures on his Right hand.  Past Medical History:  Diagnosis Date  . Arthritis   . Back pain   . GERD (gastroesophageal reflux disease)    occ  . Migraine   . Neck pain   . Numbness and tingling in left arm   . Numbness and tingling in left hand   . Sleep apnea    cpap 1 yr   Past Surgical History:  Procedure Laterality Date  . Epidural Steroid Injections    . KNEE SURGERY Left    arthroscopy  . SHOULDER ARTHROSCOPY WITH SUBACROMIAL DECOMPRESSION, ROTATOR CUFF REPAIR AND BICEP TENDON REPAIR Right 04/09/2015   Procedure: RIGHT SHOULDER ARTHROSCOPY WITH SUBACROMIAL DECOMPRESSION, DISTAL  CLAVICLE RESECTION LABRAL AND ROTATOR CUFF DEBRIDEMENT;  Surgeon: Justice Britain, MD;  Location: [facility];  Service: Orthopedics;  Laterality: Right;  . SHOULDER SURGERY Left    arthroscopy   Family History  Problem Relation Age of Onset  . Hypertension Mother   . Ovarian cancer Mother   . Hypertension Father    Social History  Substance Use Topics  . Smoking status: Former Smoker    Packs/day: 0.25    Years: 5.00    Types: Cigarettes    Quit date: 03/29/2005  . Smokeless tobacco: Not on file  . Alcohol  use 0.0 oz/week     Comment: Social use - rare    Review of Systems  Musculoskeletal: Positive for arthralgias and myalgias. Negative for joint swelling.  Skin: Negative for color change and wound.  Neurological: Negative for weakness and numbness.    Allergies  Review of patient's allergies indicates no known allergies.  Home Medications   Prior to Admission medications   Medication Sig Start Date End Date Taking? Authorizing Provider  Multiple Vitamins-Minerals (MULTIVITAMIN WITH MINERALS) tablet Take 3 tablets by mouth daily.     Historical Provider, MD  Omega-3 Fatty Acids (OMEGA 3 PO) Take 1 capsule by mouth daily.     Historical Provider, MD  OXcarbazepine (TRILEPTAL) 150 MG tablet Take 1 tablet (150 mg total) by mouth 2 (two) times daily. 08/25/15   Marcial Pacas, MD  zolpidem (AMBIEN) 10 MG tablet at bedtime as needed.  08/23/15   Historical Provider, MD   Meds Ordered and Administered this Visit  Medications - No data to display  BP 120/68 (BP Location: Right Arm)   Pulse 63   Temp 97.7 F (36.5 C) (Oral)   Resp 16   Ht 6\' 5"  (1.956 m)   Wt (!) 316 lb (143.3 kg)   SpO2 97%   BMI 37.47 kg/m  No  data found.   Physical Exam  Constitutional: He is oriented to person, place, and time. He appears well-developed and well-nourished.  HENT:  Head: Normocephalic and atraumatic.  Eyes: EOM are normal.  Neck: Normal range of motion.  Cardiovascular: Normal rate.   Pulmonary/Chest: Effort normal.  Musculoskeletal: Normal range of motion. He exhibits tenderness. He exhibits no edema.  Left hand: no edema or deformity, full ROM. Mild tenderness along 5th metacarpal, worse over MCP joint.  Full ROM wrist w/o tenderness. 5/5 grip strength   Neurological: He is alert and oriented to person, place, and time.  Skin: Skin is warm and dry. Capillary refill takes less than 2 seconds. No erythema.  Left hand: skin in tact, no ecchymosis or erythema.   Psychiatric: He has a normal  mood and affect. His behavior is normal.  Nursing note and vitals reviewed.   Urgent Care Course   Clinical Course    Procedures (including critical care time)  Labs Review Labs Reviewed - No data to display  Imaging Review Dg Hand Complete Left  Result Date: 07/07/2016 CLINICAL DATA:  Basketball injury 2 weeks ago.  Medial hand pain. EXAM: LEFT HAND - COMPLETE 3+ VIEW COMPARISON:  None. FINDINGS: There is no evidence of fracture or dislocation. There is no evidence of arthropathy or other focal bone abnormality. Soft tissues are unremarkable. IMPRESSION: Negative. Electronically Signed   By: Elon Alas M.D.   On: 07/07/2016 18:23      MDM   1. Sprain of left little finger, initial encounter    Pt c/o Left hand and little finger pain from basketball injury 10 days ago. PMS in tact  Plain films: Negative for fracture or dislocation  Will treat as sprain with strapping of fingers using buddy tape technique.  May take acetaminophen and ibuprofen for pain. F/u with PCP or Dr. Fredna Dow (who he's seen in the past) for recheck of symptoms if not improving in 2-3 weeks.     Noland Fordyce, PA-C 07/07/16 [phone]

## 2016-07-07 NOTE — ED Triage Notes (Signed)
Patient reports injury to left hand about 10 days ago while playing basketball; another player crushed lateral aspect of hand. Has not used ice or OTCs.

## 2016-08-06 ENCOUNTER — Emergency Department (INDEPENDENT_AMBULATORY_CARE_PROVIDER_SITE_OTHER): Payer: Commercial Managed Care - HMO

## 2016-08-06 ENCOUNTER — Emergency Department (INDEPENDENT_AMBULATORY_CARE_PROVIDER_SITE_OTHER)
Admission: EM | Admit: 2016-08-06 | Discharge: 2016-08-06 | Disposition: A | Discharge status: Home / Self Care | Payer: Commercial Managed Care - HMO | Source: Home / Self Care | Attending: Family Medicine | Admitting: Family Medicine

## 2016-08-06 ENCOUNTER — Encounter: Payer: Self-pay | Admitting: Emergency Medicine

## 2016-08-06 DIAGNOSIS — S43402A Unspecified sprain of left shoulder joint, initial encounter: Secondary | ICD-10-CM

## 2016-08-06 DIAGNOSIS — M25522 Pain in left elbow: Secondary | ICD-10-CM

## 2016-08-06 DIAGNOSIS — M25512 Pain in left shoulder: Secondary | ICD-10-CM | POA: Diagnosis not present

## 2016-08-06 DIAGNOSIS — S53402A Unspecified sprain of left elbow, initial encounter: Secondary | ICD-10-CM

## 2016-08-06 MED ORDER — MELOXICAM 15 MG PO TABS: 15.0000 mg | ORAL_TABLET | Freq: Every day | ORAL | 0 refills | Status: DC

## 2016-08-06 MED ORDER — HYDROCODONE-ACETAMINOPHEN 5-325 MG PO TABS: 1.0000 | ORAL_TABLET | Freq: Four times a day (QID) | ORAL | 0 refills | Status: DC | PRN

## 2016-08-06 NOTE — ED Provider Notes (Signed)
[name]    CSN: [id number] Arrival date & time: 08/06/16  1734     History   Chief Complaint Chief Complaint  Patient presents with  . Shoulder Pain    HPI [name] is a 45 y.o. male.   While playing basketball today, patient's left elbow/shoulder was forcefully rotated externally.  He has had persistent pain in his left shoulder and decreased range of motion, and pain in his left elbow. He has a past history of right labrum and rotator cuff repair, as well as left rotator cuff repair.   The history is provided by the patient.  Shoulder Pain  Location:  Shoulder and elbow Shoulder location:  L shoulder Elbow location:  L elbow Injury: yes   Time since incident:  2 hours Mechanism of injury comment:  Playing basketball Pain details:    Quality:  Aching   Radiates to:  Does not radiate   Severity:  Moderate   Onset quality:  Sudden   Duration:  2 hours   Timing:  Constant   Progression:  Worsening Handedness:  Right-handed Dislocation: no   Prior injury to area:  Yes Relieved by:  None tried Worsened by:  Movement Ineffective treatments:  None tried Associated symptoms: decreased range of motion and stiffness   Associated symptoms: no numbness and no tingling     Past Medical History:  Diagnosis Date  . Arthritis   . Back pain   . GERD (gastroesophageal reflux disease)    occ  . Migraine   . Neck pain   . Numbness and tingling in left arm   . Numbness and tingling in left hand   . Sleep apnea    cpap 1 yr    Patient Active Problem List   Diagnosis Date Noted  . Left cervical radiculopathy 08/31/2015  . Cervical disc disorder with radiculopathy of cervical region 08/31/2015    Past Surgical History:  Procedure Laterality Date  . Epidural Steroid Injections    . KNEE SURGERY Left    arthroscopy  . SHOULDER ARTHROSCOPY WITH SUBACROMIAL DECOMPRESSION, ROTATOR CUFF REPAIR AND BICEP TENDON REPAIR Right 04/09/2015   Procedure: RIGHT SHOULDER ARTHROSCOPY WITH SUBACROMIAL DECOMPRESSION, DISTAL  CLAVICLE RESECTION LABRAL AND ROTATOR CUFF DEBRIDEMENT;  Surgeon: Justice Britain, MD;  Location: [facility];  Service: Orthopedics;  Laterality: Right;  . SHOULDER SURGERY Left    arthroscopy       Home Medications    Prior to Admission medications   Medication Sig Start Date End Date Taking? Authorizing Provider  HYDROcodone-acetaminophen (NORCO/VICODIN) 5-325 MG tablet Take 1-2 tablets by mouth every 6 (six) hours as needed for moderate pain. 08/06/16   Kandra Nicolas, MD  meloxicam (MOBIC) 15 MG tablet Take 1 tablet (15 mg total) by mouth daily. Take with food each morning 08/06/16   Kandra Nicolas, MD    Family History Family History  Problem Relation Age of Onset  . Hypertension Mother   . Ovarian cancer Mother   . Hypertension Father     Social History Social History  Substance Use Topics  . Smoking status: Former Smoker    Packs/day: 0.25    Years: 5.00    Types: Cigarettes    Quit date: 03/29/2005  . Smokeless tobacco: Former Systems developer  . Alcohol use 0.0 oz/week     Comment: Social use - rare     Allergies   Review of patient's allergies indicates no known allergies.   Review of Systems Review of  Systems  Musculoskeletal: Positive for stiffness.  All other systems reviewed and are negative.    Physical Exam Triage Vital Signs ED Triage Vitals  Enc Vitals Group     BP 08/06/16 1754 123/82     Pulse Rate 08/06/16 1754 73     Resp --      Temp 08/06/16 1754 97.9 F (36.6 C)     Temp Source 08/06/16 1754 Oral     SpO2 08/06/16 1754 95 %     Weight 08/06/16 1755 (!) 317 lb (143.8 kg)     Height 08/06/16 1755 6\' 5"  (1.956 m)     Head Circumference --      Peak Flow --      Pain Score 08/06/16 1756 6     Pain Loc --      Pain Edu? --      Excl. in [place]? --    No data found.   Updated Vital Signs BP 123/82 (BP Location: Right Arm)   Pulse 73   Temp 97.9 F (36.6 C) (Oral)   Ht  6\' 5"  (1.956 m)   Wt (!) 317 lb (143.8 kg)   SpO2 95%   BMI 37.59 kg/m   Visual Acuity Right Eye Distance:   Left Eye Distance:   Bilateral Distance:    Right Eye Near:   Left Eye Near:    Bilateral Near:     Physical Exam  Constitutional: He appears well-developed and well-nourished. No distress.  HENT:  Head: Atraumatic.  Nose: Nose normal.  Mouth/Throat: Oropharynx is clear and moist.  Eyes: Conjunctivae are normal. Pupils are equal, round, and reactive to light.  Neck: Normal range of motion.  Cardiovascular: Normal heart sounds.   Pulmonary/Chest: Breath sounds normal.  Abdominal: There is no tenderness.  Musculoskeletal:       Left shoulder: He exhibits decreased range of motion, tenderness and decreased strength. He exhibits no swelling, no effusion, no crepitus, no deformity and normal pulse.  Left shoulder has decreased range of motion:  Unable to actively abduct more than 45 degrees from horizontal.  Can passively abduct to horizontal.  Positive Apley's and empty can tests.  Internal/external rotation and strength decreased. Left elbow has relatively good range of motion.  There is tenderness to palpation over the lateral epicondyle.  Distal neurovascular function is intact.   Neurological: He is alert.  Skin: Skin is warm and dry.  Nursing note and vitals reviewed.    UC Treatments / Results  Labs (all labs ordered are listed, but only abnormal results are displayed) Labs Reviewed - No data to display  EKG  EKG Interpretation None       Radiology Dg Elbow Complete Left  Result Date: 08/06/2016 CLINICAL DATA:  Pt states he was playing basketball this afternoon and his left arm was pulled backwards. States he is having left elbow pain. Pain is posterior and increases when trying to straighten arm out. EXAM: LEFT ELBOW - COMPLETE 3+ VIEW COMPARISON:  None. FINDINGS: No evidence of fracture of the ulna or humerus. The radial head is normal. No joint effusion.  IMPRESSION: No fracture or dislocation. Electronically Signed   By: Suzy Bouchard M.D.   On: 08/06/2016 18:23   Dg Shoulder Left  Result Date: 08/06/2016 CLINICAL DATA:  Pt states he was playing basketball this afternoon and his left arm was pulled backwards. States he is now having left shoulder pain with limited ROM. Pain in shoulder is anterior. EXAM: LEFT SHOULDER -  2+ VIEW COMPARISON:  None. FINDINGS: Glenohumeral joint is intact. No evidence of scapular fracture or humeral fracture. The acromioclavicular joint is intact. IMPRESSION: No fracture or dislocation. Electronically Signed   By: Suzy Bouchard M.D.   On: 08/06/2016 18:25    Procedures Procedures (including critical care time)  Medications Ordered in UC Medications - No data to display   Initial Impression / Assessment and Plan / UC Course  I have reviewed the triage vital signs and the nursing notes.  Pertinent labs & imaging results that were available during my care of the patient were reviewed by me and considered in my medical decision making (see chart for details).  Clinical Course  Suspect rotator cuff injury. Dispensed sling immobilizer. Rx for Mobic 15mg  QAM, and Lortab 1 or 2 Q6hr prn. Apply ice pack for 20 to 30 minutes, 3 to 4 times daily  Continue until pain decreases.  Wear sling immobilizer until evaluated by orthopedist. Followup with orthopedist as soon as possible.      Final Clinical Impressions(s) / UC Diagnoses   Final diagnoses:  Sprain of left shoulder, initial encounter  Sprain of left elbow, initial encounter    New Prescriptions New Prescriptions   HYDROCODONE-ACETAMINOPHEN (NORCO/VICODIN) 5-325 MG TABLET    Take 1-2 tablets by mouth every 6 (six) hours as needed for moderate pain.   MELOXICAM (MOBIC) 15 MG TABLET    Take 1 tablet (15 mg total) by mouth daily. Take with food each morning     Kandra Nicolas, MD 08/08/16 2257

## 2016-08-06 NOTE — Discharge Instructions (Signed)
Apply ice pack for 20 to 30 minutes, 3 to 4 times daily  Continue until pain decreases.  Wear sling immobilizer until evaluated by orthopedist.

## 2016-08-06 NOTE — ED Triage Notes (Signed)
Pt was playing bball today and injured his arm during the game.

## 2016-08-11 ENCOUNTER — Other Ambulatory Visit: Payer: Self-pay | Admitting: Orthopedic Surgery

## 2016-08-11 DIAGNOSIS — S46212A Strain of muscle, fascia and tendon of other parts of biceps, left arm, initial encounter: Secondary | ICD-10-CM

## 2016-08-13 ENCOUNTER — Ambulatory Visit
Admission: RE | Admit: 2016-08-13 | Discharge: 2016-08-13 | Disposition: A | Discharge status: Home / Self Care | Payer: Commercial Managed Care - HMO | Source: Ambulatory Visit | Attending: Orthopedic Surgery | Admitting: Orthopedic Surgery

## 2016-08-13 DIAGNOSIS — S46212A Strain of muscle, fascia and tendon of other parts of biceps, left arm, initial encounter: Secondary | ICD-10-CM

## 2016-08-29 ENCOUNTER — Emergency Department
Admission: EM | Admit: 2016-08-29 | Discharge: 2016-08-29 | Disposition: A | Discharge status: Home / Self Care | Payer: Commercial Managed Care - HMO | Source: Home / Self Care | Attending: Family Medicine | Admitting: Family Medicine

## 2016-08-29 ENCOUNTER — Encounter: Payer: Self-pay | Admitting: *Deleted

## 2016-08-29 DIAGNOSIS — R3915 Urgency of urination: Secondary | ICD-10-CM

## 2016-08-29 DIAGNOSIS — N390 Urinary tract infection, site not specified: Secondary | ICD-10-CM

## 2016-08-29 LAB — POCT URINALYSIS DIP (MANUAL ENTRY)
BILIRUBIN UA: NEGATIVE
Bilirubin, UA: NEGATIVE
Blood, UA: NEGATIVE
GLUCOSE UA: NEGATIVE
Leukocytes, UA: NEGATIVE
Nitrite, UA: NEGATIVE
Protein Ur, POC: NEGATIVE
SPEC GRAV UA: 1.025 (ref 1.005–1.03)
Urobilinogen, UA: 0.2 (ref 0–1)
pH, UA: 6 (ref 5–8)

## 2016-08-29 MED ORDER — CIPROFLOXACIN HCL 500 MG PO TABS: 500.0000 mg | ORAL_TABLET | Freq: Two times a day (BID) | ORAL | 0 refills | Status: DC

## 2016-08-29 NOTE — Discharge Instructions (Signed)
Increase fluid intake.  May continue Ibuprofen as needed.  If symptoms become significantly worse during the night or over the weekend, proceed to the local emergency room.

## 2016-08-29 NOTE — ED Triage Notes (Signed)
Pt c/o urinary urgency and hesitancy x 5 days post surgery. He reports sweats x 2 days. Denies fever.

## 2016-08-29 NOTE — ED Provider Notes (Signed)
[name]    CSN: [id number] Arrival date & time: 08/29/16  1814     History   Chief Complaint Chief Complaint  Patient presents with  . Urinary Frequency    HPI [name] is a 45 y.o. male.   Patient reports that he underwent surgery on left biceps tendon five days ago.  During the past five days he has developed urinary urgency and hesitancy.  He has had sweats during the past two days.  No testicular pain or urethral discharge.   The history is provided by the patient and the spouse.  Urinary Frequency  This is a new problem. Episode onset: 4 das ago. The problem occurs constantly. The problem has not changed since onset.Pertinent negatives include no abdominal pain. Nothing aggravates the symptoms. Nothing relieves the symptoms. He has tried nothing for the symptoms.    Past Medical History:  Diagnosis Date  . Arthritis   . Back pain   . GERD (gastroesophageal reflux disease)    occ  . Migraine   . Neck pain   . Numbness and tingling in left arm   . Numbness and tingling in left hand   . Sleep apnea    cpap 1 yr    Patient Active Problem List   Diagnosis Date Noted  . Left cervical radiculopathy 08/31/2015  . Cervical disc disorder with radiculopathy of cervical region 08/31/2015    Past Surgical History:  Procedure Laterality Date  . Epidural Steroid Injections    . KNEE SURGERY Left    arthroscopy  . SHOULDER ARTHROSCOPY WITH DEBRIDEMENT AND BICEP TENDON REPAIR    . SHOULDER ARTHROSCOPY WITH SUBACROMIAL DECOMPRESSION, ROTATOR CUFF REPAIR AND BICEP TENDON REPAIR Right 04/09/2015   Procedure: RIGHT SHOULDER ARTHROSCOPY WITH SUBACROMIAL DECOMPRESSION, DISTAL  CLAVICLE RESECTION LABRAL AND ROTATOR CUFF DEBRIDEMENT;  Surgeon: Justice Britain, MD;  Location: [facility];  Service: Orthopedics;  Laterality: Right;  . SHOULDER SURGERY Left    arthroscopy       Home Medications    Prior to Admission medications   Medication Sig Start Date  End Date Taking? Authorizing Provider  cyclobenzaprine (FLEXERIL) 10 MG tablet Take 10 mg by mouth 3 (three) times daily as needed for muscle spasms.   Yes Historical Provider, MD  ibuprofen (ADVIL,MOTRIN) 800 MG tablet Take 800 mg by mouth every 8 (eight) hours as needed.   Yes Historical Provider, MD  oxyCODONE-acetaminophen (PERCOCET) 10-325 MG tablet Take 1 tablet by mouth every 4 (four) hours as needed for pain.   Yes Historical Provider, MD  ciprofloxacin (CIPRO) 500 MG tablet Take 1 tablet (500 mg total) by mouth 2 (two) times daily. 08/29/16   Kandra Nicolas, MD    Family History Family History  Problem Relation Age of Onset  . Hypertension Mother   . Ovarian cancer Mother   . Hypertension Father     Social History Social History  Substance Use Topics  . Smoking status: Former Smoker    Packs/day: 0.25    Years: 5.00    Types: Cigarettes    Quit date: 03/29/2005  . Smokeless tobacco: Former Systems developer  . Alcohol use 0.0 oz/week     Comment: Social use - rare     Allergies   Review of patient's allergies indicates no known allergies.   Review of Systems Review of Systems  Constitutional: Negative for appetite change, chills, diaphoresis, fatigue and fever.  Gastrointestinal: Negative for abdominal pain.  Genitourinary: Positive for frequency.  All other  systems reviewed and are negative.    Physical Exam Triage Vital Signs ED Triage Vitals  Enc Vitals Group     BP 08/29/16 1843 136/83     Pulse Rate 08/29/16 1843 91     Resp 08/29/16 1843 16     Temp 08/29/16 1843 98.2 F (36.8 C)     Temp Source 08/29/16 1843 Oral     SpO2 08/29/16 1843 96 %     Weight 08/29/16 1844 (!) 326 lb (147.9 kg)     Height 08/29/16 1844 6\' 5"  (1.956 m)     Head Circumference --      Peak Flow --      Pain Score 08/29/16 1847 0     Pain Loc --      Pain Edu? --      Excl. in [place]? --    No data found.   Updated Vital Signs BP 136/83 (BP Location: Left Arm)   Pulse 91   Temp  98.2 F (36.8 C) (Oral)   Resp 16   Ht 6\' 5"  (1.956 m)   Wt (!) 326 lb (147.9 kg)   SpO2 96%   BMI 38.66 kg/m   Visual Acuity Right Eye Distance:   Left Eye Distance:   Bilateral Distance:    Right Eye Near:   Left Eye Near:    Bilateral Near:     Physical Exam Nursing notes and Vital Signs reviewed. Appearance:  Patient appears stated age, and in no acute distress.    Eyes:  Pupils are equal, round, and reactive to light and accomodation.  Extraocular movement is intact.  Conjunctivae are not inflamed   Pharynx:  Normal; moist mucous membranes  Neck:  Supple.  No adenopathy Lungs:  Clear to auscultation.  Breath sounds are equal.  Moving air well. Heart:  Regular rate and rhythm without murmurs, rubs, or gallops.  Abdomen:  Nontender without masses or hepatosplenomegaly.  Bowel sounds are present.  No CVA or flank tenderness.  Extremities:  No edema.  Skin:  No rash present.     UC Treatments / Results  Labs (all labs ordered are listed, but only abnormal results are displayed) Labs Reviewed  URINE CULTURE   Narrative:    Performed at:  [facility], [id number]                [place], [place] [address]  POCT URINALYSIS DIP (MANUAL ENTRY) negative    EKG  EKG Interpretation None       Radiology No results found.  Procedures Procedures (including critical care time)  Medications Ordered in UC Medications - No data to display   Initial Impression / Assessment and Plan / UC Course  I have reviewed the triage vital signs and the nursing notes.  Pertinent labs & imaging results that were available during my care of the patient were reviewed by me and considered in my medical decision making (see chart for details).  Clinical Course  Suspect prostatitis.  Urine culture pending. Begin empiric Cipro 500mg  BID Increase fluid intake.  May continue Ibuprofen as needed.  If symptoms become significantly worse during the night  or over the weekend, proceed to the local emergency room.  Followup with Family Doctor if not improved in one week.      Final Clinical Impressions(s) / UC Diagnoses   Final diagnoses:  Urgency of urination  Urinary tract infection without hematuria, site unspecified    New Prescriptions Discharge Medication List as of 08/29/2016  7:31 PM    START taking these medications   Details  ciprofloxacin (CIPRO) 500 MG tablet Take 1 tablet (500 mg total) by mouth 2 (two) times daily., Starting Mon 08/29/2016, Print         Kandra Nicolas, MD 09/02/16 [phone]

## 2016-08-31 ENCOUNTER — Telehealth: Payer: Self-pay | Admitting: *Deleted

## 2016-08-31 LAB — URINE CULTURE: Organism ID, Bacteria: NO GROWTH

## 2016-08-31 NOTE — Telephone Encounter (Signed)
Spoke to pt given Ucx results. He reports that he no longer has sweats, but still has back spasms and urinary frequency. He has a post op appt tomorrow. Advised him to continue ABT, keep f/u appt with surgeon, advise his PCP if what has transpired since his surgery and f/u with his PCP if he fails to improve. Pt agrees. Charna Archer, LPN

## 2016-11-09 DIAGNOSIS — M66822 Spontaneous rupture of other tendons, left upper arm: Secondary | ICD-10-CM | POA: Diagnosis not present

## 2016-11-09 DIAGNOSIS — M25622 Stiffness of left elbow, not elsewhere classified: Secondary | ICD-10-CM | POA: Diagnosis not present

## 2016-11-09 DIAGNOSIS — M25522 Pain in left elbow: Secondary | ICD-10-CM | POA: Diagnosis not present

## 2016-11-11 DIAGNOSIS — M25522 Pain in left elbow: Secondary | ICD-10-CM | POA: Diagnosis not present

## 2016-11-11 DIAGNOSIS — M66822 Spontaneous rupture of other tendons, left upper arm: Secondary | ICD-10-CM | POA: Diagnosis not present

## 2016-11-11 DIAGNOSIS — M25622 Stiffness of left elbow, not elsewhere classified: Secondary | ICD-10-CM | POA: Diagnosis not present

## 2016-11-17 DIAGNOSIS — M25522 Pain in left elbow: Secondary | ICD-10-CM | POA: Diagnosis not present

## 2016-11-17 DIAGNOSIS — R531 Weakness: Secondary | ICD-10-CM | POA: Diagnosis not present

## 2016-11-17 DIAGNOSIS — M25622 Stiffness of left elbow, not elsewhere classified: Secondary | ICD-10-CM | POA: Diagnosis not present

## 2016-11-21 DIAGNOSIS — M25522 Pain in left elbow: Secondary | ICD-10-CM | POA: Diagnosis not present

## 2016-11-21 DIAGNOSIS — R531 Weakness: Secondary | ICD-10-CM | POA: Diagnosis not present

## 2016-11-21 DIAGNOSIS — M25622 Stiffness of left elbow, not elsewhere classified: Secondary | ICD-10-CM | POA: Diagnosis not present

## 2016-11-28 DIAGNOSIS — M25622 Stiffness of left elbow, not elsewhere classified: Secondary | ICD-10-CM | POA: Diagnosis not present

## 2016-11-28 DIAGNOSIS — M66822 Spontaneous rupture of other tendons, left upper arm: Secondary | ICD-10-CM | POA: Diagnosis not present

## 2016-11-28 DIAGNOSIS — M25522 Pain in left elbow: Secondary | ICD-10-CM | POA: Diagnosis not present

## 2016-12-05 DIAGNOSIS — M25622 Stiffness of left elbow, not elsewhere classified: Secondary | ICD-10-CM | POA: Diagnosis not present

## 2016-12-05 DIAGNOSIS — M66822 Spontaneous rupture of other tendons, left upper arm: Secondary | ICD-10-CM | POA: Diagnosis not present

## 2016-12-05 DIAGNOSIS — M25522 Pain in left elbow: Secondary | ICD-10-CM | POA: Diagnosis not present

## 2016-12-07 DIAGNOSIS — M66822 Spontaneous rupture of other tendons, left upper arm: Secondary | ICD-10-CM | POA: Diagnosis not present

## 2016-12-07 DIAGNOSIS — M25522 Pain in left elbow: Secondary | ICD-10-CM | POA: Diagnosis not present

## 2016-12-07 DIAGNOSIS — M25622 Stiffness of left elbow, not elsewhere classified: Secondary | ICD-10-CM | POA: Diagnosis not present

## 2016-12-14 DIAGNOSIS — M66822 Spontaneous rupture of other tendons, left upper arm: Secondary | ICD-10-CM | POA: Diagnosis not present

## 2016-12-14 DIAGNOSIS — M25622 Stiffness of left elbow, not elsewhere classified: Secondary | ICD-10-CM | POA: Diagnosis not present

## 2016-12-14 DIAGNOSIS — M25522 Pain in left elbow: Secondary | ICD-10-CM | POA: Diagnosis not present

## 2016-12-15 DIAGNOSIS — L309 Dermatitis, unspecified: Secondary | ICD-10-CM | POA: Diagnosis not present

## 2016-12-15 DIAGNOSIS — J0100 Acute maxillary sinusitis, unspecified: Secondary | ICD-10-CM | POA: Diagnosis not present

## 2017-02-21 DIAGNOSIS — D497 Neoplasm of unspecified behavior of endocrine glands and other parts of nervous system: Secondary | ICD-10-CM | POA: Diagnosis not present

## 2017-02-21 DIAGNOSIS — Z0000 Encounter for general adult medical examination without abnormal findings: Secondary | ICD-10-CM | POA: Diagnosis not present

## 2017-02-21 DIAGNOSIS — G43109 Migraine with aura, not intractable, without status migrainosus: Secondary | ICD-10-CM | POA: Diagnosis not present

## 2017-02-22 DIAGNOSIS — D497 Neoplasm of unspecified behavior of endocrine glands and other parts of nervous system: Secondary | ICD-10-CM | POA: Diagnosis not present

## 2017-02-22 DIAGNOSIS — Z0000 Encounter for general adult medical examination without abnormal findings: Secondary | ICD-10-CM | POA: Diagnosis not present

## 2017-03-01 DIAGNOSIS — R891 Abnormal level of hormones in specimens from other organs, systems and tissues: Secondary | ICD-10-CM | POA: Diagnosis not present

## 2017-03-01 DIAGNOSIS — H538 Other visual disturbances: Secondary | ICD-10-CM | POA: Diagnosis not present

## 2017-03-01 DIAGNOSIS — G43909 Migraine, unspecified, not intractable, without status migrainosus: Secondary | ICD-10-CM | POA: Diagnosis not present

## 2017-06-13 DIAGNOSIS — A09 Infectious gastroenteritis and colitis, unspecified: Secondary | ICD-10-CM | POA: Diagnosis not present

## 2017-07-17 DIAGNOSIS — Z713 Dietary counseling and surveillance: Secondary | ICD-10-CM | POA: Diagnosis not present

## 2017-07-17 DIAGNOSIS — E221 Hyperprolactinemia: Secondary | ICD-10-CM | POA: Diagnosis not present

## 2017-07-28 DIAGNOSIS — Z87898 Personal history of other specified conditions: Secondary | ICD-10-CM | POA: Diagnosis not present

## 2017-07-28 DIAGNOSIS — E221 Hyperprolactinemia: Secondary | ICD-10-CM | POA: Diagnosis not present

## 2017-10-03 DIAGNOSIS — R0789 Other chest pain: Secondary | ICD-10-CM | POA: Diagnosis not present

## 2017-10-10 ENCOUNTER — Other Ambulatory Visit: Payer: Self-pay | Admitting: Physical Medicine and Rehabilitation

## 2017-10-10 DIAGNOSIS — M7918 Myalgia, other site: Secondary | ICD-10-CM

## 2017-10-10 DIAGNOSIS — M79604 Pain in right leg: Secondary | ICD-10-CM

## 2017-10-10 DIAGNOSIS — M5416 Radiculopathy, lumbar region: Secondary | ICD-10-CM | POA: Diagnosis not present

## 2017-10-10 DIAGNOSIS — M25519 Pain in unspecified shoulder: Secondary | ICD-10-CM | POA: Diagnosis not present

## 2017-10-10 DIAGNOSIS — M25551 Pain in right hip: Secondary | ICD-10-CM | POA: Diagnosis not present

## 2017-10-10 DIAGNOSIS — M545 Low back pain: Secondary | ICD-10-CM | POA: Diagnosis not present

## 2017-10-10 DIAGNOSIS — J329 Chronic sinusitis, unspecified: Secondary | ICD-10-CM | POA: Diagnosis not present

## 2017-10-18 ENCOUNTER — Other Ambulatory Visit: Payer: 59

## 2017-10-24 ENCOUNTER — Encounter (INDEPENDENT_AMBULATORY_CARE_PROVIDER_SITE_OTHER): Payer: Self-pay

## 2017-10-24 ENCOUNTER — Ambulatory Visit: Payer: 59 | Admitting: Cardiovascular Disease

## 2017-10-24 ENCOUNTER — Encounter: Payer: Self-pay | Admitting: Cardiovascular Disease

## 2017-10-24 VITALS — BP 131/71 | HR 101 | Ht 77.0 in | Wt 313.0 lb

## 2017-10-24 DIAGNOSIS — R0602 Shortness of breath: Secondary | ICD-10-CM

## 2017-10-24 DIAGNOSIS — R0789 Other chest pain: Secondary | ICD-10-CM | POA: Diagnosis not present

## 2017-10-24 DIAGNOSIS — R079 Chest pain, unspecified: Secondary | ICD-10-CM | POA: Diagnosis not present

## 2017-10-24 DIAGNOSIS — R358 Other polyuria: Secondary | ICD-10-CM | POA: Diagnosis not present

## 2017-10-24 DIAGNOSIS — R61 Generalized hyperhidrosis: Secondary | ICD-10-CM

## 2017-10-24 DIAGNOSIS — G4733 Obstructive sleep apnea (adult) (pediatric): Secondary | ICD-10-CM | POA: Diagnosis not present

## 2017-10-24 DIAGNOSIS — R3589 Other polyuria: Secondary | ICD-10-CM

## 2017-10-24 DIAGNOSIS — R634 Abnormal weight loss: Secondary | ICD-10-CM

## 2017-10-24 HISTORY — DX: Abnormal weight loss: R63.4

## 2017-10-24 HISTORY — DX: Other chest pain: R07.89

## 2017-10-24 HISTORY — DX: Morbid (severe) obesity due to excess calories: E66.01

## 2017-10-24 HISTORY — DX: Other polyuria: R35.89

## 2017-10-24 HISTORY — DX: Obstructive sleep apnea (adult) (pediatric): G47.33

## 2017-10-24 HISTORY — DX: Generalized hyperhidrosis: R61

## 2017-10-24 NOTE — Patient Instructions (Addendum)
Medication Instructions:  NO CHANGES  Labwork: TSH/FT4/CBC/CMET/URINALYSIS/A1C TODAY   Testing/Procedures: Your physician has requested that you have an exercise tolerance test. For further information please visit [email or web address]. Please also follow instruction sheet, as given.  Follow-Up: Your physician recommends that you schedule a follow-up appointment in: 4-6 WEEKS   If you need a refill on your cardiac medications before your next appointment, please call your pharmacy.   Exercise Stress Electrocardiogram An exercise stress electrocardiogram is a test to check how blood flows to your heart. It is done to find areas of poor blood flow. You will need to walk on a treadmill for this test. The electrocardiogram will record your heartbeat when you are at rest and when you are exercising. What happens before the procedure?  Do not have drinks with caffeine or foods with caffeine for 24 hours before the test, or as told by your doctor. This includes coffee, tea (even decaf tea), sodas, chocolate, and cocoa.  Follow your doctor's instructions about eating and drinking before the test.  Ask your doctor what medicines you should or should not take before the test. Take your medicines with water unless told by your doctor not to.  If you use an inhaler, bring it with you to the test.  Bring a snack to eat after the test.  Do not  smoke for 4 hours before the test.  Do not put lotions, powders, creams, or oils on your chest before the test.  Wear comfortable shoes and clothing. What happens during the procedure?  You will have patches put on your chest. Small areas of your chest may need to be shaved. Wires will be connected to the patches.  Your heart rate will be watched while you are resting and while you are exercising.  You will walk on the treadmill. The treadmill will slowly get faster to raise your heart rate.  The test will take about 1-2 hours. What happens after  the procedure?  Your heart rate and blood pressure will be watched after the test.  You may return to your normal diet, activities, and medicines or as told by your doctor. This information is not intended to replace advice given to you by your health care provider. Make sure you discuss any questions you have with your health care provider. Document Released: 04/11/2008 Document Revised: 06/22/2016 Document Reviewed: 07/01/2013 Elsevier Interactive Patient Education  [employer].

## 2017-10-24 NOTE — Progress Notes (Signed)
Cardiology Office Note  Date:  10/24/2017   ID:  [name], DOB [birth year], MRN [id number]  PCP:  Lawerance Cruel, MD  Cardiologist:   Skeet Latch, MD   Chief Complaint  Patient presents with  . New Patient (Initial Visit)    having chest pain, radiating in shoulder and left arm, shortness of breath, night sweats, unable to sleep at night     History of Present Illness: [name] is a 46 y.o. male with OSA who is being seen today for the evaluation of shortness of breath and exertional chest pain at the request of Lawerance Cruel, MD.  [name] saw Dr. Harrington Challenger on 09/2017 and reported exertional chest pain.  EKG at that appointment was unremarkable.  He was referred to cardiology for further evaluation.  For the last two months he has been feeling poorly.  Prior to that he was working out 2-3 times per week without symptoms.  Since then he reports recurrent episodes of chest pain that occur daily and multiple times per day.  It occurs both when sitting at rest, with movement of his left arm, and when walking.  The symptoms are particularly severe when walking up steps.  They are associated with diaphoresis.  He feels pain in his left chest and numbness and tingling in his left arm.  The pain is intense and causes him to stop what he is doing and take deep breaths.  Last year he tore the left bicep and required surgery but has not had any recent injuries.  Over this time.  He is also reported severe night sweats.  During the day he also has heat intolerance and sweating.  This occurs both in addition to the chest pain and separately from the chest pain.  He has been waking up 7 or 8 times to urinate.  He also reports excessive thirst and 20 lb unintentional weight loss.  He hasn't been particularly stressed lately and nothing has changed in his life.  He denies lower extremity edema and orthopnea.  He had one episode of hematochezia that he attributes to hemorrhoids.   He denies melena.  He thinks he may have seen some blood in his urine.    Past Medical History:  Diagnosis Date  . Arthritis   . Atypical chest pain 10/24/2017  . Atypical chest pain 10/24/2017  . Back pain   . GERD (gastroesophageal reflux disease)    occ  . Migraine   . Morbid obesity ([facility]) 10/24/2017  . Neck pain   . Night sweats 10/24/2017  . Numbness and tingling in left arm   . Numbness and tingling in left hand   . OSA (obstructive sleep apnea) 10/24/2017  . Polyuria 10/24/2017  . Sleep apnea    cpap 1 yr  . Unintentional weight loss 10/24/2017    Past Surgical History:  Procedure Laterality Date  . Epidural Steroid Injections    . KNEE SURGERY Left    arthroscopy  . SHOULDER ARTHROSCOPY WITH DEBRIDEMENT AND BICEP TENDON REPAIR    . SHOULDER ARTHROSCOPY WITH SUBACROMIAL DECOMPRESSION, ROTATOR CUFF REPAIR AND BICEP TENDON REPAIR Right 04/09/2015   Procedure: RIGHT SHOULDER ARTHROSCOPY WITH SUBACROMIAL DECOMPRESSION, DISTAL  CLAVICLE RESECTION LABRAL AND ROTATOR CUFF DEBRIDEMENT;  Surgeon: Justice Britain, MD;  Location: [facility];  Service: Orthopedics;  Laterality: Right;  . SHOULDER SURGERY Left    arthroscopy     No current outpatient medications on file.   No current facility-administered medications  for this visit.     Allergies:   Patient has no known allergies.    Social History:  The patient  reports that he quit smoking about 12 years ago. His smoking use included cigarettes. He has a 1.25 pack-year smoking history. He has quit using smokeless tobacco. He reports that he drinks alcohol. He reports that he does not use drugs.   Family History:  The patient's family history includes Cancer in his mother; Diabetes in his maternal grandmother; Heart attack in his maternal grandfather; Hypertension in his brother, father, mother, and sister; Ovarian cancer in his mother; Stomach cancer in his father; Stroke in his maternal grandmother; Thyroid disease in his mother.      ROS:  Please see the history of present illness.   Otherwise, review of systems are positive for migraines.   All other systems are reviewed and negative.    PHYSICAL EXAM: VS:  BP 131/71   Pulse (!) 101   Ht 6\' 5"  (1.956 m)   Wt (!) 313 lb (142 kg)   BMI 37.12 kg/m  , BMI Body mass index is 37.12 kg/m. GENERAL:  Well appearing.  Diaphoretic. HEENT:  Pupils equal round and reactive, fundi not visualized, oral mucosa unremarkable NECK:  No jugular venous distention, waveform within normal limits, carotid upstroke brisk and symmetric, no bruits, no thyromegaly LUNGS:  Clear to auscultation bilaterally HEART:  RRR.  PMI not displaced or sustained,S1 and S2 within normal limits, no S3, no S4, no clicks, no rubs, no murmurs ABD:  Flat, positive bowel sounds normal in frequency in pitch, no bruits, no rebound, no guarding, no midline pulsatile mass, no hepatomegaly, no splenomegaly EXT:  2 plus pulses throughout, no edema, no cyanosis no clubbing SKIN:  No rashes no nodules NEURO:  Cranial nerves II through XII grossly intact, motor grossly intact throughout PSYCH:  Cognitively intact, oriented to person place and time   EKG:  EKG is not ordered today. 10/03/17: Sinus rhythm.  Rate 82 bpm.   Recent Labs: No results found for requested labs within last 8760 hours.   06/13/17: WBC 6.0, hemoglobin 13.7, hematocrit 39.7, platelets 280 Sodium 140, potassium 4.1, BUN 18, creatinine 1.12 AST 42, ALT 24  02/22/17: Total cholesterol 161, triglycerides 77, HDL 52, LDL 94 TSH 0.81  Lipid Panel No results found for: CHOL, TRIG, HDL, CHOLHDL, VLDL, LDLCALC, LDLDIRECT    Wt Readings from Last 3 Encounters:  10/24/17 (!) 313 lb (142 kg)  08/29/16 (!) 326 lb (147.9 kg)  08/06/16 (!) 317 lb (143.8 kg)      ASSESSMENT AND PLAN:  # Atypical chest pain: Pain occurs both at rest, with exertion and with movement of his shoulder.  I suspect this is musculoskeletal.  We will get Brian ETT to  assess for ischemia given the exertional component.  Lipids were good 02/2017.   # Night sweats: Check CBC   # Polydipsia: # Polyuria: Concerning for diabetes.  Check CMP and hemoglobin A1c.  # Hematuria: Check CBC and u/a.  # Unintentional weight loss: Check CBC, u/a and thyroid function. Concerning for malignancy and hyperthyroidism.  He needs to discuss this with his PCP and consider imaging.    Current medicines are reviewed at length with the patient today.  The patient does not have concerns regarding medicines.  The following changes have been made:  no change  Labs/ tests ordered today include:   Orders Placed This Encounter  Procedures  . T4, free  . TSH  .  Comprehensive metabolic panel  . HgB A1c  . Urinalysis  . CBC with Differential/Platelet  . Exercise Tolerance Test     Disposition:   FU with Brian Riga C. Oval Linsey, MD, [facility] in 1 month.     This note was written with the assistance of speech recognition software.  Please excuse any transcriptional errors.  Signed, Aletheia Tangredi C. Oval Linsey, MD, [facility]  10/24/2017 9:18 AM    [facility]

## 2017-10-25 DIAGNOSIS — E041 Nontoxic single thyroid nodule: Secondary | ICD-10-CM | POA: Diagnosis not present

## 2017-10-25 DIAGNOSIS — E0590 Thyrotoxicosis, unspecified without thyrotoxic crisis or storm: Secondary | ICD-10-CM | POA: Diagnosis not present

## 2017-10-25 DIAGNOSIS — N451 Epididymitis: Secondary | ICD-10-CM | POA: Diagnosis not present

## 2017-10-25 LAB — CBC WITH DIFFERENTIAL/PLATELET
Basophils Absolute: 0 10*3/uL (ref 0.0–0.2)
Basos: 0 %
EOS (ABSOLUTE): 0 10*3/uL (ref 0.0–0.4)
EOS: 1 %
HEMOGLOBIN: 14.3 g/dL (ref 13.0–17.7)
Hematocrit: 40.6 % (ref 37.5–51.0)
IMMATURE GRANS (ABS): 0 10*3/uL (ref 0.0–0.1)
Immature Granulocytes: 0 %
LYMPHS: 35 %
Lymphocytes Absolute: 1.7 10*3/uL (ref 0.7–3.1)
MCH: 29.4 pg (ref 26.6–33.0)
MCHC: 35.2 g/dL (ref 31.5–35.7)
MCV: 84 fL (ref 79–97)
MONOCYTES: 13 %
Monocytes Absolute: 0.6 10*3/uL (ref 0.1–0.9)
NEUTROS ABS: 2.4 10*3/uL (ref 1.4–7.0)
Neutrophils: 51 %
Platelets: 253 10*3/uL (ref 150–379)
RBC: 4.86 x10E6/uL (ref 4.14–5.80)
RDW: 12.7 % (ref 12.3–15.4)
WBC: 4.8 10*3/uL (ref 3.4–10.8)

## 2017-10-25 LAB — URINALYSIS
Bilirubin, UA: NEGATIVE
Glucose, UA: NEGATIVE
Ketones, UA: NEGATIVE
LEUKOCYTES UA: NEGATIVE
NITRITE UA: NEGATIVE
PH UA: 5 (ref 5.0–7.5)
Protein, UA: NEGATIVE
RBC UA: NEGATIVE
SPEC GRAV UA: 1.023 (ref 1.005–1.030)
Urobilinogen, Ur: 0.2 mg/dL (ref 0.2–1.0)

## 2017-10-25 LAB — COMPREHENSIVE METABOLIC PANEL
ALT: 47 IU/L — ABNORMAL HIGH (ref 0–44)
AST: 25 IU/L (ref 0–40)
Albumin/Globulin Ratio: 2.1 (ref 1.2–2.2)
Albumin: 4.4 g/dL (ref 3.5–5.5)
Alkaline Phosphatase: 73 IU/L (ref 39–117)
BUN/Creatinine Ratio: 28 — ABNORMAL HIGH (ref 9–20)
BUN: 19 mg/dL (ref 6–24)
Bilirubin Total: 0.6 mg/dL (ref 0.0–1.2)
CALCIUM: 10.1 mg/dL (ref 8.7–10.2)
CO2: 21 mmol/L (ref 20–29)
Chloride: 100 mmol/L (ref 96–106)
Creatinine, Ser: 0.68 mg/dL — ABNORMAL LOW (ref 0.76–1.27)
GFR, EST AFRICAN AMERICAN: 131 mL/min/{1.73_m2} (ref >59)
GFR, EST NON AFRICAN AMERICAN: 113 mL/min/{1.73_m2} (ref >59)
GLOBULIN, TOTAL: 2.1 g/dL (ref 1.5–4.5)
Glucose: 89 mg/dL (ref 65–99)
Potassium: 4.3 mmol/L (ref 3.5–5.2)
Sodium: 137 mmol/L (ref 134–144)
Total Protein: 6.5 g/dL (ref 6.0–8.5)

## 2017-10-25 LAB — HEMOGLOBIN A1C
Est. average glucose Bld gHb Est-mCnc: 120 mg/dL
Hgb A1c MFr Bld: 5.8 % — ABNORMAL HIGH (ref 4.8–5.6)

## 2017-10-25 LAB — TSH: TSH: <0.006 u[IU]/mL — ABNORMAL LOW (ref 0.450–4.500)

## 2017-10-25 LAB — T4, FREE: Free T4: 7.31 ng/dL — ABNORMAL HIGH (ref 0.82–1.77)

## 2017-10-26 ENCOUNTER — Other Ambulatory Visit: Payer: Self-pay | Admitting: Family Medicine

## 2017-10-26 ENCOUNTER — Telehealth (HOSPITAL_COMMUNITY): Payer: Self-pay

## 2017-10-26 DIAGNOSIS — E041 Nontoxic single thyroid nodule: Secondary | ICD-10-CM

## 2017-10-26 NOTE — Telephone Encounter (Signed)
Encounter complete. 

## 2017-11-01 DIAGNOSIS — G4733 Obstructive sleep apnea (adult) (pediatric): Secondary | ICD-10-CM | POA: Diagnosis not present

## 2017-11-02 ENCOUNTER — Ambulatory Visit (HOSPITAL_COMMUNITY)
Admission: RE | Admit: 2017-11-02 | Discharge: 2017-11-02 | Disposition: A | Discharge status: Home / Self Care | Payer: 59 | Source: Ambulatory Visit | Attending: Cardiology | Admitting: Cardiology

## 2017-11-02 DIAGNOSIS — R0602 Shortness of breath: Secondary | ICD-10-CM | POA: Diagnosis not present

## 2017-11-02 DIAGNOSIS — R079 Chest pain, unspecified: Secondary | ICD-10-CM | POA: Diagnosis not present

## 2017-11-02 DIAGNOSIS — R61 Generalized hyperhidrosis: Secondary | ICD-10-CM

## 2017-11-02 DIAGNOSIS — I491 Atrial premature depolarization: Secondary | ICD-10-CM | POA: Diagnosis not present

## 2017-11-02 LAB — EXERCISE TOLERANCE TEST
CSEPED: 9 min
CSEPEDS: 0 s
CSEPEW: 10.1 METS
CSEPHR: 106 %
CSEPPHR: 184 {beats}/min
MPHR: 172 {beats}/min
RPE: 18
Rest HR: 75 {beats}/min

## 2017-11-06 ENCOUNTER — Telehealth: Payer: Self-pay | Admitting: *Deleted

## 2017-11-06 MED ORDER — METOPROLOL TARTRATE 25 MG PO TABS: 25.0000 mg | ORAL_TABLET | Freq: Two times a day (BID) | ORAL | 5 refills | Status: DC

## 2017-11-06 NOTE — Telephone Encounter (Signed)
Advised patient, verbalized

## 2017-11-06 NOTE — Telephone Encounter (Signed)
-----   Message from Skeet Latch, MD sent at 11/06/2017  9:12 AM EST ----- Good exercise capacity.  His BP got very high with exercise and he had some extra heartbeats with exercise.  This is probably because of his overactive thyroid.  Recommend starting metoprolol tartrate 25mg  twice daily.  This will help his fast heart rate and extra heartbeats.

## 2017-11-14 NOTE — Telephone Encounter (Signed)
Advised patient and scheduled follow up  

## 2017-11-14 NOTE — Telephone Encounter (Signed)
-----   Message from Skeet Latch, MD sent at 11/14/2017  5:39 PM EST ----- He is OK to exercise as long as his resting heart rate is under 100 bpm on metoprolol.  Please set him up to follow up with an APP within the next couple weeks.  ~Tiffany ----- Message ----- From: [name] Sent: 11/14/2017   9:00 AM To: Skeet Latch, MD  Jannifer Rodney So Mr [place] had ETT      Good exercise capacity. His BP got very high with exercise and he     had some extra heartbeats with exercise. This is probably                   because of his overactive thyroid. Recommend starting metoprolol     tartrate 25mg  twice daily. This will help his fast heart rate and             extra heartbeats. He wants to know it is ok to do heavy working out or rate until things under better control Hope you had a good weekend  Thanks Bryant

## 2017-11-20 DIAGNOSIS — E0590 Thyrotoxicosis, unspecified without thyrotoxic crisis or storm: Secondary | ICD-10-CM | POA: Diagnosis not present

## 2017-11-20 DIAGNOSIS — E221 Hyperprolactinemia: Secondary | ICD-10-CM | POA: Diagnosis not present

## 2017-11-20 DIAGNOSIS — Z79899 Other long term (current) drug therapy: Secondary | ICD-10-CM | POA: Diagnosis not present

## 2017-11-21 ENCOUNTER — Ambulatory Visit
Admission: RE | Admit: 2017-11-21 | Discharge: 2017-11-21 | Disposition: A | Discharge status: Home / Self Care | Payer: 59 | Source: Ambulatory Visit | Attending: Family Medicine | Admitting: Family Medicine

## 2017-11-21 DIAGNOSIS — E041 Nontoxic single thyroid nodule: Secondary | ICD-10-CM

## 2017-11-21 DIAGNOSIS — E010 Iodine-deficiency related diffuse (endemic) goiter: Secondary | ICD-10-CM | POA: Diagnosis not present

## 2017-11-25 ENCOUNTER — Encounter: Payer: Self-pay | Admitting: Emergency Medicine

## 2017-11-25 ENCOUNTER — Emergency Department
Admission: EM | Admit: 2017-11-25 | Discharge: 2017-11-25 | Disposition: A | Discharge status: Home / Self Care | Payer: 59 | Source: Home / Self Care | Attending: Family Medicine | Admitting: Family Medicine

## 2017-11-25 ENCOUNTER — Other Ambulatory Visit: Payer: Self-pay

## 2017-11-25 DIAGNOSIS — B9789 Other viral agents as the cause of diseases classified elsewhere: Secondary | ICD-10-CM | POA: Diagnosis not present

## 2017-11-25 DIAGNOSIS — J069 Acute upper respiratory infection, unspecified: Secondary | ICD-10-CM

## 2017-11-25 LAB — POCT RAPID STREP A (OFFICE): Rapid Strep A Screen: NEGATIVE

## 2017-11-25 MED ORDER — BENZONATATE 200 MG PO CAPS: ORAL_CAPSULE | ORAL | 0 refills | Status: DC

## 2017-11-25 MED ORDER — AZITHROMYCIN 250 MG PO TABS: ORAL_TABLET | ORAL | 0 refills | Status: DC

## 2017-11-25 NOTE — ED Triage Notes (Signed)
Patient presents to [facility] with sore throat and cough

## 2017-11-25 NOTE — Discharge Instructions (Signed)
Take plain guaifenesin (1200mg  extended release tabs such as Mucinex) twice daily, with plenty of water, for cough and congestion.  May add Pseudoephedrine (30mg , one or two every 4 to 6 hours) for sinus congestion.  Get adequate rest.   May use Afrin nasal spray (or generic oxymetazoline) each morning for about 5 days and then discontinue.  Also recommend using saline nasal spray several times daily and saline nasal irrigation (AYR is a common brand).  Use Flonase nasal spray each morning after using Afrin nasal spray and saline nasal irrigation. Try warm salt water gargles for sore throat.  Stop all antihistamines for now, and other non-prescription cough/cold preparations. May take Ibuprofen 200mg , 4 tabs every 8 hours with food for headache, body aches, etc.

## 2017-11-25 NOTE — ED Provider Notes (Signed)
[name] CARE    CSN: [id number] Arrival date & time: 11/25/17  0904     History   Chief Complaint Chief Complaint  Patient presents with  . Sore Throat    HPI [name] is a 47 y.o. male.   Patient developed a non-productive cough about 5 days ago but did not feel ill.  During the past two days he has developed mild sore throat and sinus congestion, headache, and fatigue.  He has had sweats but no fever.  He complains of pressure in his left ear.  He often coughs until he gags. He has a history of seasonal rhinitis (springtime).  His last Tdap was about 5 to 6 years ago.   The history is provided by the patient.    Past Medical History:  Diagnosis Date  . Arthritis   . Atypical chest pain 10/24/2017  . Atypical chest pain 10/24/2017  . Back pain   . GERD (gastroesophageal reflux disease)    occ  . Migraine   . Morbid obesity ([place]) 10/24/2017  . Neck pain   . Night sweats 10/24/2017  . Numbness and tingling in left arm   . Numbness and tingling in left hand   . OSA (obstructive sleep apnea) 10/24/2017  . Polyuria 10/24/2017  . Sleep apnea    cpap 1 yr  . Unintentional weight loss 10/24/2017    Patient Active Problem List   Diagnosis Date Noted  . OSA (obstructive sleep apnea) 10/24/2017  . Atypical chest pain 10/24/2017  . Unintentional weight loss 10/24/2017  . Polyuria 10/24/2017  . Night sweats 10/24/2017  . Left cervical radiculopathy 08/31/2015  . Cervical disc disorder with radiculopathy of cervical region 08/31/2015    Past Surgical History:  Procedure Laterality Date  . Epidural Steroid Injections    . KNEE SURGERY Left    arthroscopy  . SHOULDER ARTHROSCOPY WITH DEBRIDEMENT AND BICEP TENDON REPAIR    . SHOULDER ARTHROSCOPY WITH SUBACROMIAL DECOMPRESSION, ROTATOR CUFF REPAIR AND BICEP TENDON REPAIR Right 04/09/2015   Procedure: RIGHT SHOULDER ARTHROSCOPY WITH SUBACROMIAL DECOMPRESSION, DISTAL  CLAVICLE RESECTION LABRAL AND  ROTATOR CUFF DEBRIDEMENT;  Surgeon: Justice Britain, MD;  Location: [place];  Service: Orthopedics;  Laterality: Right;  . SHOULDER SURGERY Left    arthroscopy       Home Medications    Prior to Admission medications   Medication Sig Start Date End Date Taking? Authorizing Provider  azithromycin (ZITHROMAX Z-PAK) 250 MG tablet Take 2 tabs today; then begin one tab once daily for 4 more days. 11/25/17   Kandra Nicolas, MD  benzonatate (TESSALON) 200 MG capsule Take one cap by mouth at bedtime as needed for cough.  May repeat in 4 to 6 hours 11/25/17   Kandra Nicolas, MD  methimazole (TAPAZOLE) 5 MG tablet Take 5 mg by mouth 2 (two) times daily.    [provider]  metoprolol tartrate (LOPRESSOR) 25 MG tablet Take 1 tablet (25 mg total) by mouth 2 (two) times daily. 11/06/17   Skeet Latch, MD    Family History Family History  Problem Relation Age of Onset  . Hypertension Mother   . Ovarian cancer Mother   . Cancer Mother        ovarian  . Thyroid disease Mother   . Hypertension Father   . Stomach cancer Father   . Hypertension Sister   . Hypertension Brother   . Stroke Maternal Grandmother   . Diabetes Maternal Grandmother   .  Heart attack Maternal Grandfather     Social History Social History   Tobacco Use  . Smoking status: Former Smoker    Packs/day: 0.25    Years: 5.00    Pack years: 1.25    Types: Cigarettes    Last attempt to quit: 03/29/2005    Years since quitting: 12.6  . Smokeless tobacco: Former Network engineer Use Topics  . Alcohol use: Yes    Alcohol/week: 0.0 oz    Comment: Social use - rare  . Drug use: No     Allergies   Patient has no known allergies.   Review of Systems Review of Systems   Physical Exam Triage Vital Signs ED Triage Vitals  Enc Vitals Group     BP 11/25/17 0929 125/75     Pulse Rate 11/25/17 0929 71     Resp 11/25/17 0929 16     Temp 11/25/17 0929 97.9 F (36.6 C)     Temp Source 11/25/17 0929 Oral      SpO2 11/25/17 0929 96 %     Weight 11/25/17 0929 (!) 311 lb 12 oz (141.4 kg)     Height 11/25/17 0929 6\' 5"  (1.956 m)     Head Circumference --      Peak Flow --      Pain Score 11/25/17 0931 0     Pain Loc --      Pain Edu? --      Excl. in [place]? --    No data found.  Updated Vital Signs BP 125/75 (BP Location: Left Arm)   Pulse 71   Temp 97.9 F (36.6 C) (Oral)   Resp 16   Ht 6\' 5"  (1.956 m)   Wt (!) 311 lb 12 oz (141.4 kg)   SpO2 96%   BMI 36.97 kg/m   Visual Acuity Right Eye Distance:   Left Eye Distance:   Bilateral Distance:    Right Eye Near:   Left Eye Near:    Bilateral Near:     Physical Exam Nursing notes and Vital Signs reviewed. Appearance:  Patient appears stated age, and in no acute distress Eyes:  Pupils are equal, round, and reactive to light and accomodation.  Extraocular movement is intact.  Conjunctivae are not inflamed  Ears:  Canals normal.  Tympanic membranes normal.  Nose:  Mildly congested turbinates.  No sinus tenderness. Pharynx:  Normal Neck:  Supple.  Enlarged posterior/lateral nodes are palpated bilaterally, tender to palpation on the left.   Lungs:  Clear to auscultation.  Breath sounds are equal.  Moving air well. Heart:  Regular rate and rhythm without murmurs, rubs, or gallops.  Abdomen:  Nontender without masses or hepatosplenomegaly.  Bowel sounds are present.  No CVA or flank tenderness.  Extremities:  No edema.  Skin:  No rash present.    UC Treatments / Results  Labs (all labs ordered are listed, but only abnormal results are displayed) Labs Reviewed  POCT RAPID STREP A (OFFICE) negative    EKG  EKG Interpretation None       Radiology No results found.  Procedures Procedures (including critical care time)  Medications Ordered in UC Medications - No data to display   Initial Impression / Assessment and Plan / UC Course  I have reviewed the triage vital signs and the nursing notes.  Pertinent labs & imaging  results that were available during my care of the patient were reviewed by me and considered in my medical decision making (  see chart for details).    ?pertussis.  Begin Z-pak for atypical coverage. Prescription written for Benzonatate [facility]) to take at bedtime for night-time cough.  Take plain guaifenesin (1200mg  extended release tabs such as Mucinex) twice daily, with plenty of water, for cough and congestion.  May add Pseudoephedrine (30mg , one or two every 4 to 6 hours) for sinus congestion.  Get adequate rest.   May use Afrin nasal spray (or generic oxymetazoline) each morning for about 5 days and then discontinue.  Also recommend using saline nasal spray several times daily and saline nasal irrigation (AYR is a common brand).  Use Flonase nasal spray each morning after using Afrin nasal spray and saline nasal irrigation. Try warm salt water gargles for sore throat.  Stop all antihistamines for now, and other non-prescription cough/cold preparations. May take Ibuprofen 200mg , 4 tabs every 8 hours with food for headache, body aches, etc.    Final Clinical Impressions(s) / UC Diagnoses   Final diagnoses:  Viral URI with cough    ED Discharge Orders        Ordered    azithromycin (ZITHROMAX Z-PAK) 250 MG tablet     11/25/17 0948    benzonatate (TESSALON) 200 MG capsule     11/25/17 0948           Kandra Nicolas, MD 12/01/17 1622

## 2017-11-28 ENCOUNTER — Encounter: Payer: Self-pay | Admitting: Adult Health

## 2017-11-29 ENCOUNTER — Encounter: Payer: Self-pay | Admitting: Adult Health

## 2017-11-29 ENCOUNTER — Ambulatory Visit: Payer: 59 | Admitting: Adult Health

## 2017-11-29 VITALS — BP 122/68 | HR 64 | Ht 77.0 in | Wt 312.0 lb

## 2017-11-29 DIAGNOSIS — I10 Essential (primary) hypertension: Secondary | ICD-10-CM

## 2017-11-29 NOTE — Progress Notes (Signed)
Cardiology Office Note   Date:  11/29/2017   ID:  [name] 1971-09-10, MRN [id number]  PCP:  Lawerance Cruel, MD  Cardiologist: Dr. Skeet Latch Chief Complaint  Patient presents with  . Follow-up     History of Present Illness: [name] is a 47 y.o. male who presents for ongoing assessment and management of chronic shortness of breath.  The patient has a history of obstructive sleep apnea.  The patient had had ongoing worsening dyspnea which occurred while he was working out and climbing stairs which had not been previously experienced.  Symptoms were also associated with diaphoresis and mild chest discomfort.  On most recent visit with Dr. Oval Linsey on 10/24/2017, it was felt that the patient's chest pain was atypical however due to his exertional discomfort and ETT was ordered to assess for ischemia.  Due to the other complaints of night sweats and polyuria, CBC, hemoglobin A1c and CMP was ordered.  Also a TSH was ordered as he had had some unintentional weight loss.  Stress test revealed hypertensive response to exercise with no acute ST-T wave changes noted during stress test.  Blood pressure elevated to 229/141.  The patient achieved 10.1 metastases.  Labs are reviewed with essentially normal levels, creatinine 0.60.  Potassium 4.3.  She was not found to be anemic hemoglobin was 14.3 with hematocrit of 40.8.  TSH was abnormal 8 (low at 0.06. ).  Dr. Oval Linsey started the patient on metoprolol.  Offers no further cardiac complaints.  Blood pressure and heart rate is been stable at home.  He wants to begin jogging again.  The patient is formally a Curator, and also likes remain very active.  He is not been running until he was seen by our office on follow-up.  Is medically compliant.  Past Medical History:  Diagnosis Date  . Arthritis   . Atypical chest pain 10/24/2017  . Atypical chest pain 10/24/2017  . Back pain   . GERD  (gastroesophageal reflux disease)    occ  . Migraine   . Morbid obesity ([place]) 10/24/2017  . Neck pain   . Night sweats 10/24/2017  . Numbness and tingling in left arm   . Numbness and tingling in left hand   . OSA (obstructive sleep apnea) 10/24/2017  . Polyuria 10/24/2017  . Sleep apnea    cpap 1 yr  . Unintentional weight loss 10/24/2017    Past Surgical History:  Procedure Laterality Date  . Epidural Steroid Injections    . KNEE SURGERY Left    arthroscopy  . SHOULDER ARTHROSCOPY WITH DEBRIDEMENT AND BICEP TENDON REPAIR    . SHOULDER ARTHROSCOPY WITH SUBACROMIAL DECOMPRESSION, ROTATOR CUFF REPAIR AND BICEP TENDON REPAIR Right 04/09/2015   Procedure: RIGHT SHOULDER ARTHROSCOPY WITH SUBACROMIAL DECOMPRESSION, DISTAL  CLAVICLE RESECTION LABRAL AND ROTATOR CUFF DEBRIDEMENT;  Surgeon: Justice Britain, MD;  Location: [place];  Service: Orthopedics;  Laterality: Right;  . SHOULDER SURGERY Left    arthroscopy     Current Outpatient Medications  Medication Sig Dispense Refill  . fluticasone (FLONASE) 50 MCG/ACT nasal spray Place 2 sprays into both nostrils daily.    . methimazole (TAPAZOLE) 5 MG tablet Take 5 mg by mouth 2 (two) times daily.    . metoprolol tartrate (LOPRESSOR) 25 MG tablet Take 1 tablet (25 mg total) by mouth 2 (two) times daily. 60 tablet 5   No current facility-administered medications for this visit.     Allergies:   Patient has  no known allergies.    Social History:  The patient  reports that he quit smoking about 12 years ago. His smoking use included cigarettes. He has a 1.25 pack-year smoking history. He has quit using smokeless tobacco. He reports that he drinks alcohol. He reports that he does not use drugs.   Family History:  The patient's family history includes Cancer in his mother; Diabetes in his maternal grandmother; Heart attack in his maternal grandfather; Hypertension in his brother, father, mother, and sister; Ovarian cancer in his mother; Stomach  cancer in his father; Stroke in his maternal grandmother; Thyroid disease in his mother.    ROS: All other systems are reviewed and negative. Unless otherwise mentioned in H&P    PHYSICAL EXAM: VS:  BP 122/68   Pulse 64   Ht 6\' 5"  (1.956 m)   Wt (!) 312 lb (141.5 kg)   BMI 37.00 kg/m  , BMI Body mass index is 37 kg/m. GEN: Well nourished, well developed, in no acute distress  HEENT: normal  Neck: no JVD, carotid bruits, or masses Cardiac: RRR; no murmurs, rubs, or gallops,no edema  Respiratory: Some upper airway wheezing, cleared with cough.  Normal breathing. GI: soft, nontender, nondistended, + BS MS: no deformity or atrophy  Skin: warm and dry, no rash Neuro:  Strength and sensation are intact Psych: euthymic mood, full affect    Recent Labs: 10/24/2017: ALT 47; BUN 19; Creatinine, Ser 0.68; Hemoglobin 14.3; Platelets 253; Potassium 4.3; Sodium 137; TSH <0.006    Lipid Panel No results found for: CHOL, TRIG, HDL, CHOLHDL, VLDL, LDLCALC, LDLDIRECT    Wt Readings from Last 3 Encounters:  11/29/17 (!) 312 lb (141.5 kg)  11/25/17 (!) 311 lb 12 oz (141.4 kg)  10/24/17 (!) 313 lb (142 kg)      Other studies Reviewed: 11/02/2017  Study Highlights   Addendum by Sueanne Margarita, MD on Thu Nov 02, 2017 11:57 PM   Blood pressure demonstrated a hypertensive response to exercise.  There was no ST segment deviation noted during stress.  There was a hypertensive BP response to exercise up to 229/12mmHg. The SBP dropped 188mmHg at peak exericse although unclear if this was an erroneous reading as it was not rechecked. BP 1 minute into recovery was normal.  The patient achieved 10.99mets and 106% MPHR.  There were frequent PACs and nonsustained atrial tachycardia at peak exercise.    Finalized by Sueanne Margarita, MD on Thu Nov 02, 2017 6:09 PM   Blood pressure demonstrated a hypertensive response to exercise.  There was no ST segment deviation noted during  stress.  There was a hypertensive BP response to exercise up to 229/13mmHg. The SBP dropped 19mmHg at peak exericse although unclear if this was an erroneous reading as it was not rechecked. BP 1 minute into recovery was normal.  The patient achieved 10.82mets and 106% MPHR.      ASSESSMENT AND PLAN:  1.  Hypertension: The patient is doing well and blood pressure is currently controlled.  He will continue on metoprolol tartrate 25 mg twice daily.  I have released him to be able to continue athletic activity, jogging, playing basketball, working out.  I have advised him that his resting heart rate may be a little lower as he has been an athlete.  If heart rate drops into the 40s at rest he will need to call us.  We expect his heart rate to go up with exercise.  2.  Newly diagnosed  Graves' disease: Followed by primary care and endocrinology.  He continues on Tapazole twice daily.   Current medicines are reviewed at length with the patient today.    Labs/ tests ordered today include:   Phill Myron. [place] Pugh, ANP, AACC   11/29/2017 11:16 AM    [facility] [address] [place], [place] [address] Phone: [phone]; Fax: [phone]

## 2017-11-29 NOTE — Patient Instructions (Signed)
Medication Instructions:  NO CHANGES-Your physician recommends that you continue on your current medications as directed. Please refer to the Current Medication list given to you today.  If you need a refill on your cardiac medications before your next appointment, please call your pharmacy.  Special Instructions: OK TO START JOGGING AGAIN  Follow-Up: Your physician wants you to follow-up in:  [place]. You should receive a reminder letter in the mail two months in advance. If you do not receive a letter, please call our office MAY 2019 to schedule the July 2019 follow-up appointment.  Thank you for choosing [facility] at [facility]!!

## 2017-12-12 DIAGNOSIS — H6501 Acute serous otitis media, right ear: Secondary | ICD-10-CM | POA: Diagnosis not present

## 2017-12-12 DIAGNOSIS — R05 Cough: Secondary | ICD-10-CM | POA: Diagnosis not present

## 2017-12-12 DIAGNOSIS — R031 Nonspecific low blood-pressure reading: Secondary | ICD-10-CM | POA: Diagnosis not present

## 2017-12-22 DIAGNOSIS — E0590 Thyrotoxicosis, unspecified without thyrotoxic crisis or storm: Secondary | ICD-10-CM | POA: Diagnosis not present

## 2017-12-29 ENCOUNTER — Ambulatory Visit: Payer: 59 | Admitting: Cardiovascular Disease

## 2017-12-29 ENCOUNTER — Encounter: Payer: Self-pay | Admitting: Cardiovascular Disease

## 2017-12-29 VITALS — BP 102/74 | HR 64 | Ht 77.0 in | Wt 331.4 lb

## 2017-12-29 DIAGNOSIS — E669 Obesity, unspecified: Secondary | ICD-10-CM

## 2017-12-29 DIAGNOSIS — E0500 Thyrotoxicosis with diffuse goiter without thyrotoxic crisis or storm: Secondary | ICD-10-CM | POA: Diagnosis not present

## 2017-12-29 DIAGNOSIS — R002 Palpitations: Secondary | ICD-10-CM

## 2017-12-29 HISTORY — DX: Thyrotoxicosis with diffuse goiter without thyrotoxic crisis or storm: E05.00

## 2017-12-29 NOTE — Progress Notes (Signed)
Cardiology Office Note  Date:  12/29/2017   ID:  [name], [name] 22-Mar-1971, MRN [id number]  PCP:  Lawerance Cruel, MD  Cardiologist:   Skeet Latch, MD   No chief complaint on file.    History of Present Illness: [name] is a 46 y.o. male with OSA and Grave's disease here for follow up.  He was initially seen 10/2017 for the evaluation of shortness of breath and exertional chest pain.  At that appointment he also reported unintentional weight loss and night sweats.  He was found to be hyperthyroid.  His PCP started methimazole and we started metoprolol.  His chest pain was atypical but he was referred for ETT 10/2017 that was negative for ischemia.  He had a hypertensive response to exercise (229/41) and was noted to have frequent PACs and non-sustained atrial tachycardia.  He achieved 10.1 METS on a Bruce protocol.  Since starting treatment for his thyroid [name] has been feeling much better.  He reports that his endocrinologist recently reduced his methimazole to once daily.  His blood pressure has been running low.  It is been as low as the 70Y over 17C systolic.  When this occurred he felt lightheaded and dizzy.  He started taking metoprolol only once per day.   He has started back exercising in the gym for the last 2-3 weeks.  She feels great with exercise.  Yesterday he noted that his heart was pounding after he exerted himself a lot.  He is also started using his CPAP for at least 6-1/2 hours each night.  He limits his salt intake and has been working on his diet as well.   Past Medical History:  Diagnosis Date  . Arthritis   . Atypical chest pain 10/24/2017  . Atypical chest pain 10/24/2017  . Back pain   . GERD (gastroesophageal reflux disease)    occ  . Graves disease 12/29/2017  . Migraine   . Morbid obesity ([place]) 10/24/2017  . Neck pain   . Night sweats 10/24/2017  . Numbness and tingling in left arm   . Numbness and tingling in left hand    . OSA (obstructive sleep apnea) 10/24/2017  . Polyuria 10/24/2017  . Sleep apnea    cpap 1 yr  . Unintentional weight loss 10/24/2017    Past Surgical History:  Procedure Laterality Date  . Epidural Steroid Injections    . KNEE SURGERY Left    arthroscopy  . SHOULDER ARTHROSCOPY WITH DEBRIDEMENT AND BICEP TENDON REPAIR    . SHOULDER ARTHROSCOPY WITH SUBACROMIAL DECOMPRESSION, ROTATOR CUFF REPAIR AND BICEP TENDON REPAIR Right 04/09/2015   Procedure: RIGHT SHOULDER ARTHROSCOPY WITH SUBACROMIAL DECOMPRESSION, DISTAL  CLAVICLE RESECTION LABRAL AND ROTATOR CUFF DEBRIDEMENT;  Surgeon: Justice Britain, MD;  Location: [place];  Service: Orthopedics;  Laterality: Right;  . SHOULDER SURGERY Left    arthroscopy     Current Outpatient Medications  Medication Sig Dispense Refill  . fluticasone (FLONASE) 50 MCG/ACT nasal spray Place 2 sprays into both nostrils as needed.     . methimazole (TAPAZOLE) 5 MG tablet Take 5 mg by mouth daily.      No current facility-administered medications for this visit.     Allergies:   Patient has no known allergies.    Social History:  The patient  reports that he quit smoking about 12 years ago. His smoking use included cigarettes. He has a 1.25 pack-year smoking history. He has quit using smokeless tobacco. He  reports that he drinks alcohol. He reports that he does not use drugs.   Family History:  The patient's family history includes Cancer in his mother; Diabetes in his maternal grandmother; Heart attack in his maternal grandfather; Hypertension in his brother, father, mother, and sister; Ovarian cancer in his mother; Stomach cancer in his father; Stroke in his maternal grandmother; Thyroid disease in his mother.    ROS:  Please see the history of present illness.   Otherwise, review of systems are positive for migraines.   All other systems are reviewed and negative.    PHYSICAL EXAM: VS:  BP 102/74   Pulse 64   Ht 6\' 5"  (1.956 m)   Wt (!) 331 lb 6.4  oz (150.3 kg)   BMI 39.30 kg/m  , BMI Body mass index is 39.3 kg/m. GENERAL:  Well appearing.  No acute distress  HEENT: Pupils equal round and reactive, fundi not visualized, oral mucosa unremarkable NECK:  No jugular venous distention, waveform within normal limits, carotid upstroke brisk and symmetric, no bruits LUNGS:  Clear to auscultation bilaterally HEART:  RRR.  PMI not displaced or sustained,S1 and S2 within normal limits, no S3, no S4, no clicks, no rubs, no murmurs ABD:  Flat, positive bowel sounds normal in frequency in pitch, no bruits, no rebound, no guarding, no midline pulsatile mass, no hepatomegaly, no splenomegaly EXT:  2 plus pulses throughout, no edema, no cyanosis no clubbing SKIN:  No rashes no nodules NEURO:  Cranial nerves II through XII grossly intact, motor grossly intact throughout PSYCH:  Cognitively intact, oriented to person place and time   EKG:  EKG is not ordered today. 10/03/17: Sinus rhythm.  Rate 82 bpm.   Recent Labs: 10/24/2017: ALT 47; BUN 19; Creatinine, Ser 0.68; Hemoglobin 14.3; Platelets 253; Potassium 4.3; Sodium 137; TSH <0.006   06/13/17: WBC 6.0, hemoglobin 13.7, hematocrit 39.7, platelets 280 Sodium 140, potassium 4.1, BUN 18, creatinine 1.12 AST 42, ALT 24  02/22/17: Total cholesterol 161, triglycerides 77, HDL 52, LDL 94 TSH 0.81  Lipid Panel No results found for: CHOL, TRIG, HDL, CHOLHDL, VLDL, LDLCALC, LDLDIRECT    Wt Readings from Last 3 Encounters:  12/29/17 (!) 331 lb 6.4 oz (150.3 kg)  11/29/17 (!) 312 lb (141.5 kg)  11/25/17 (!) 311 lb 12 oz (141.4 kg)      ASSESSMENT AND PLAN:  # Palpitations: Resolved.  His BP is running low.  Stop metoprolol.  This was all likely 2/2 OSA and Graves, both of which are being treated.   # Atypical chest pain: Resolved with treatment of Graves.  # Night sweats: Resolved.  # OSA: Continue CPAP.  # Obesity: Encouraged increased diet and exercise.    Current medicines are  reviewed at length with the patient today.  The patient does not have concerns regarding medicines.  The following changes have been made:  Stop metoprolol.  Labs/ tests ordered today include:   No orders of the defined types were placed in this encounter.    Disposition:   FU with Brian Holberg C. Oval Linsey, MD, [facility] as needed.  This note was written with the assistance of speech recognition software.  Please excuse any transcriptional errors.  Signed, Brian Wilz C. Oval Linsey, MD, [facility]  12/29/2017 9:27 AM    [place]

## 2017-12-29 NOTE — Patient Instructions (Signed)
Medication Instructions:  STOP METOPROLOL   Labwork: NONE   Testing/Procedures: NONE3  Follow-Up: AS NEEDED

## 2018-01-04 DIAGNOSIS — M7651 Patellar tendinitis, right knee: Secondary | ICD-10-CM | POA: Diagnosis not present

## 2018-01-22 DIAGNOSIS — E0590 Thyrotoxicosis, unspecified without thyrotoxic crisis or storm: Secondary | ICD-10-CM | POA: Diagnosis not present

## 2018-02-28 DIAGNOSIS — Z87898 Personal history of other specified conditions: Secondary | ICD-10-CM | POA: Diagnosis not present

## 2018-02-28 DIAGNOSIS — E0590 Thyrotoxicosis, unspecified without thyrotoxic crisis or storm: Secondary | ICD-10-CM | POA: Diagnosis not present

## 2018-02-28 DIAGNOSIS — Z5181 Encounter for therapeutic drug level monitoring: Secondary | ICD-10-CM | POA: Diagnosis not present

## 2018-03-26 DIAGNOSIS — E0590 Thyrotoxicosis, unspecified without thyrotoxic crisis or storm: Secondary | ICD-10-CM | POA: Diagnosis not present

## 2018-03-29 DIAGNOSIS — Z13220 Encounter for screening for lipoid disorders: Secondary | ICD-10-CM | POA: Diagnosis not present

## 2018-03-29 DIAGNOSIS — Z0000 Encounter for general adult medical examination without abnormal findings: Secondary | ICD-10-CM | POA: Diagnosis not present

## 2018-04-06 ENCOUNTER — Ambulatory Visit: Payer: 59 | Admitting: Cardiology

## 2018-04-06 ENCOUNTER — Encounter: Payer: Self-pay | Admitting: Cardiology

## 2018-04-06 VITALS — BP 132/64 | HR 65 | Ht 76.0 in | Wt 320.6 lb

## 2018-04-06 DIAGNOSIS — E0500 Thyrotoxicosis with diffuse goiter without thyrotoxic crisis or storm: Secondary | ICD-10-CM

## 2018-04-06 DIAGNOSIS — R002 Palpitations: Secondary | ICD-10-CM | POA: Diagnosis not present

## 2018-04-06 DIAGNOSIS — R000 Tachycardia, unspecified: Secondary | ICD-10-CM | POA: Insufficient documentation

## 2018-04-06 MED ORDER — METOPROLOL TARTRATE 25 MG PO TABS: ORAL_TABLET | ORAL | 3 refills | Status: DC

## 2018-04-06 NOTE — Assessment & Plan Note (Signed)
On treatment- followed by Dr Buddy Duty

## 2018-04-06 NOTE — Patient Instructions (Addendum)
Medication Instructions:  START metoprolol tartrate (Lopressor) --take 0.5-1 tablet daily AS NEEDED for palpitations  Follow-Up: 3 months with Dr. Oval Linsey   If you need a refill on your cardiac medications before your next appointment, please call your pharmacy.

## 2018-04-06 NOTE — Assessment & Plan Note (Signed)
EKG today shows NSR at 65

## 2018-04-06 NOTE — Progress Notes (Signed)
04/06/2018 [name]   July 13, 1971  [id number]  Primary Physician Lawerance Cruel, MD Primary Cardiologist: Dr Oval Linsey  HPI:  Mr [name] is a 47 y/o AA male, played basketball at [employer] and then in [place]. He had seen [place] in the past for tachycardia and was and chest pain. He had a POET that showed a HTN response. He was ultimately found to be hyperthyroid, Dr Buddy Duty follows him. Dr Oval Linsey had stopped his Lopressor at his LOV in Feb 2019. Recently the pt noted an episode of tachycardia HR 90-100 while at rest. He took a Metoprolol with relief. He is in the office today for follow up. He denies any sustained tachycardia since that episode.    Current Outpatient Medications  Medication Sig Dispense Refill  . fluticasone (FLONASE) 50 MCG/ACT nasal spray Place 2 sprays into both nostrils as needed.     . Loratadine (CLARITIN PO) Take by mouth.    . methimazole (TAPAZOLE) 10 MG tablet 2 (two) times daily.     . Omega-3 Fatty Acids (FISH OIL PO) Take by mouth.    . metoprolol tartrate (LOPRESSOR) 25 MG tablet Take 0.5-1 tablet daily as needed for palpitations 30 tablet 3   No current facility-administered medications for this visit.     No Known Allergies  Past Medical History:  Diagnosis Date  . Arthritis   . Atypical chest pain 10/24/2017  . Atypical chest pain 10/24/2017  . Back pain   . GERD (gastroesophageal reflux disease)    occ  . Graves disease 12/29/2017  . Migraine   . Morbid obesity ([place]) 10/24/2017  . Neck pain   . Night sweats 10/24/2017  . Numbness and tingling in left arm   . Numbness and tingling in left hand   . OSA (obstructive sleep apnea) 10/24/2017  . Polyuria 10/24/2017  . Sleep apnea    cpap 1 yr  . Unintentional weight loss 10/24/2017    Social History   Socioeconomic History  . Marital status: Married    Spouse name: Not on file  . Number of children: 2  . Years of education: College   . Highest education level: Not on file    Occupational History  . Occupation: General Floor Help - [employer]  Social Needs  . Financial resource strain: Not on file  . Food insecurity:    Worry: Not on file    Inability: Not on file  . Transportation needs:    Medical: Not on file    Non-medical: Not on file  Tobacco Use  . Smoking status: Former Smoker    Packs/day: 0.25    Years: 5.00    Pack years: 1.25    Types: Cigarettes    Last attempt to quit: 03/29/2005    Years since quitting: 13.0  . Smokeless tobacco: Former Network engineer and Sexual Activity  . Alcohol use: Yes    Alcohol/week: 0.0 oz    Comment: Social use - rare  . Drug use: No  . Sexual activity: Not on file  Lifestyle  . Physical activity:    Days per week: Not on file    Minutes per session: Not on file  . Stress: Not on file  Relationships  . Social connections:    Talks on phone: Not on file    Gets together: Not on file    Attends religious service: Not on file    Active member of club or organization: Not on file  Attends meetings of clubs or organizations: Not on file    Relationship status: Not on file  . Intimate partner violence:    Fear of current or ex partner: Not on file    Emotionally abused: Not on file    Physically abused: Not on file    Forced sexual activity: Not on file  Other Topics Concern  . Not on file  Social History Narrative   Lives with wife and two children.   Right-handed.   Occasional use of caffeine.     Family History  Problem Relation Age of Onset  . Hypertension Mother   . Ovarian cancer Mother   . Cancer Mother        ovarian  . Thyroid disease Mother   . Hypertension Father   . Stomach cancer Father   . Hypertension Sister   . Hypertension Brother   . Stroke Maternal Grandmother   . Diabetes Maternal Grandmother   . Heart attack Maternal Grandfather      Review of Systems: General: negative for chills, fever, night sweats or weight changes.  Cardiovascular: negative for chest pain,  dyspnea on exertion, edema, orthopnea, palpitations, paroxysmal nocturnal dyspnea or shortness of breath Dermatological: negative for rash Respiratory: negative for cough or wheezing Urologic: negative for hematuria Abdominal: negative for nausea, vomiting, diarrhea, bright red blood per rectum, melena, or hematemesis Neurologic: negative for visual changes, syncope, or dizziness All other systems reviewed and are otherwise negative except as noted above.    Blood pressure 132/64, pulse 65, height 6\' 4"  (1.93 m), weight (!) 320 lb 9.6 oz (145.4 kg).  General appearance: alert, cooperative and no distress Neck: no carotid bruit and no JVD, thyroid enlarged Lungs: clear to auscultation bilaterally Heart: regular rate and rhythm Extremities: no edema Skin: Skin color, texture, turgor normal. No rashes or lesions Neurologic: Grossly normal  EKG NSR-65  ASSESSMENT AND PLAN:   Tachycardia EKG today shows NSR at 65  Graves disease On treatment- followed by Dr Buddy Duty   PLAN  Metoprolol 12.5-25 mg once PRN tachycardia. Avoid stimulants and caffeine.   Kerin Ransom PA-C 04/06/2018 4:18 PM

## 2018-05-22 DIAGNOSIS — R29898 Other symptoms and signs involving the musculoskeletal system: Secondary | ICD-10-CM | POA: Diagnosis not present

## 2018-05-22 DIAGNOSIS — G43909 Migraine, unspecified, not intractable, without status migrainosus: Secondary | ICD-10-CM | POA: Diagnosis not present

## 2018-06-07 DIAGNOSIS — E0590 Thyrotoxicosis, unspecified without thyrotoxic crisis or storm: Secondary | ICD-10-CM | POA: Diagnosis not present

## 2018-06-12 DIAGNOSIS — Z87898 Personal history of other specified conditions: Secondary | ICD-10-CM | POA: Diagnosis not present

## 2018-06-12 DIAGNOSIS — E0590 Thyrotoxicosis, unspecified without thyrotoxic crisis or storm: Secondary | ICD-10-CM | POA: Diagnosis not present

## 2018-06-12 DIAGNOSIS — Z5181 Encounter for therapeutic drug level monitoring: Secondary | ICD-10-CM | POA: Diagnosis not present

## 2018-07-04 ENCOUNTER — Ambulatory Visit: Payer: 59 | Admitting: Cardiovascular Disease

## 2018-07-04 ENCOUNTER — Encounter: Payer: Self-pay | Admitting: Cardiovascular Disease

## 2018-07-04 VITALS — BP 112/60 | HR 69 | Ht 77.0 in | Wt 322.0 lb

## 2018-07-04 DIAGNOSIS — G4733 Obstructive sleep apnea (adult) (pediatric): Secondary | ICD-10-CM | POA: Diagnosis not present

## 2018-07-04 DIAGNOSIS — Z01812 Encounter for preprocedural laboratory examination: Secondary | ICD-10-CM | POA: Diagnosis not present

## 2018-07-04 DIAGNOSIS — R072 Precordial pain: Secondary | ICD-10-CM | POA: Diagnosis not present

## 2018-07-04 DIAGNOSIS — E669 Obesity, unspecified: Secondary | ICD-10-CM | POA: Diagnosis not present

## 2018-07-04 NOTE — Patient Instructions (Addendum)
Medication Instructions:  TAKE METOPROLOL 50 MG 1 HOUR PRIOR TO YOUR CT  Labwork: BMET 1 WEEK PRIOR TO YOUR CT   Testing/Procedures: Your physician has requested that you have cardiac CT. Cardiac computed tomography (CT) is a painless test that uses an x-ray machine to take clear, detailed pictures of your heart. For further information please visit [facility]uge[facility]tn. Please follow instruction sheet as given. THE OFFICE WILL CALL YOU AND SCHEDULE ONCE THIS HAS BEEN APPROVED BY YOUR INSURANCE   Follow-Up: Your physician wants you to follow-up in: 1 YEAR  You will receive a reminder letter in the mail two months in advance. If you don't receive a letter, please call our office to schedule the follow-up appointment.  Any Other Special Instructions Will Be Listed Below (If Applicable).                       Please arrive at the [facility] main entrance of [facility] at xx:xx AM (30-45 minutes prior to test start time)  [facility] [place], [place] [address] [phone]  Proceed to the [facility] Radiology Department (First Floor).  Please follow these instructions carefully (unless otherwise directed):  Hold all erectile dysfunction medications at least 48 hours prior to test.  On the Night Before the Test: . Drink plenty of water. . Do not consume any caffeinated/decaffeinated beverages or chocolate 12 hours prior to your test. . Do not take any antihistamines 12 hours prior to your test. . If the patient has contrast allergy: ? Patient will need a prescription for Prednisone and very clear instructions (as follows): 1. Prednisone 50 mg - take 13 hours prior to test 2. Take another Prednisone 50 mg 7 hours prior to test 3. Take another Prednisone 50 mg 1 hour prior to test 4. Take Benadryl 50 mg 1 hour prior to test . Patient must complete all four doses of above prophylactic medications. . Patient will need a ride  after test due to Benadryl.  On the Day of the Test: . Drink plenty of water. Do not drink any water within one hour of the test. . Do not eat any food 4 hours prior to the test. . You may take your regular medications prior to the test. . IF NOT ON A BETA BLOCKER - Take 50 mg of lopressor (metoprolol) one hour before the test.  After the Test: . Drink plenty of water. . After receiving IV contrast, you may experience a mild flushed feeling. This is normal. . On occasion, you may experience a mild rash up to 24 hours after the test. This is not dangerous. If this occurs, you can take Benadryl 25 mg and increase your fluid intake. . If you experience trouble breathing, this can be serious. If it is severe call 911 IMMEDIATELY. If it is mild, please call our office. . If you take any of these medications: Glipizide/Metformin, Avandament, Glucavance, please do not take 48 hours after completing test.   Cardiac CT Angiogram A cardiac CT angiogram is a procedure to look at the heart and the area around the heart. It may be done to help find the cause of chest pains or other symptoms of heart disease. During this procedure, a large X-ray machine, called a CT scanner, takes detailed pictures of the heart and the surrounding area after a dye (contrast material) has been injected into blood vessels in the area. The procedure is also sometimes called a  coronary CT angiogram, coronary artery scanning, or CTA. A cardiac CT angiogram allows the health care provider to see how well blood is flowing to and from the heart. The health care provider will be able to see if there are any problems, such as:  Blockage or narrowing of the coronary arteries in the heart.  Fluid around the heart.  Signs of weakness or disease in the muscles, valves, and tissues of the heart.  Tell a health care provider about:  Any allergies you have. This is especially important if you have had a previous allergic reaction to  contrast dye.  All medicines you are taking, including vitamins, herbs, eye drops, creams, and over-the-counter medicines.  Any blood disorders you have.  Any surgeries you have had.  Any medical conditions you have.  Whether you are pregnant or may be pregnant.  Any anxiety disorders, chronic pain, or other conditions you have that may increase your stress or prevent you from lying still. What are the risks? Generally, this is a safe procedure. However, problems may occur, including:  Bleeding.  Infection.  Allergic reactions to medicines or dyes.  Damage to other structures or organs.  Kidney damage from the dye or contrast that is used.  Increased risk of cancer from radiation exposure. This risk is low. Talk with your health care provider about: ? The risks and benefits of testing. ? How you can receive the lowest dose of radiation.  What happens before the procedure?  Wear comfortable clothing and remove any jewelry, glasses, dentures, and hearing aids.  Follow instructions from your health care provider about eating and drinking. This may include: ? For 12 hours before the test - avoid caffeine. This includes tea, coffee, soda, energy drinks, and diet pills. Drink plenty of water or other fluids that do not have caffeine in them. Being well-hydrated can prevent complications. ? For 4-6 hours before the test - stop eating and drinking. The contrast dye can cause nausea, but this is less likely if your stomach is empty.  Ask your health care provider about changing or stopping your regular medicines. This is especially important if you are taking diabetes medicines, blood thinners, or medicines to treat erectile dysfunction. What happens during the procedure?  Hair on your chest may need to be removed so that small sticky patches called electrodes can be placed on your chest. These will transmit information that helps to monitor your heart during the test.  An IV tube  will be inserted into one of your veins.  You might be given a medicine to control your heart rate during the test. This will help to ensure that good images are obtained.  You will be asked to lie on an exam table. This table will slide in and out of the CT machine during the procedure.  Contrast dye will be injected into the IV tube. You might feel warm, or you may get a metallic taste in your mouth.  You will be given a medicine (nitroglycerin) to relax (dilate) the arteries in your heart.  The table that you are lying on will move into the CT machine tunnel for the scan.  The person running the machine will give you instructions while the scans are being done. You may be asked to: ? Keep your arms above your head. ? Hold your breath. ? Stay very still, even if the table is moving.  When the scanning is complete, you will be moved out of the machine.  The  IV tube will be removed. The procedure may vary among health care providers and hospitals. What happens after the procedure?  You might feel warm, or you may get a metallic taste in your mouth from the contrast dye.  You may have a headache from the nitroglycerin.  After the procedure, drink water or other fluids to wash (flush) the contrast material out of your body.  Contact a health care provider if you have any symptoms of allergy to the contrast. These symptoms include: ? Shortness of breath. ? Rash or hives. ? A racing heartbeat.  Most people can return to their normal activities right after the procedure. Ask your health care provider what activities are safe for you.  It is up to you to get the results of your procedure. Ask your health care provider, or the department that is doing the procedure, when your results will be ready. Summary  A cardiac CT angiogram is a procedure to look at the heart and the area around the heart. It may be done to help find the cause of chest pains or other symptoms of heart  disease.  During this procedure, a large X-ray machine, called a CT scanner, takes detailed pictures of the heart and the surrounding area after a dye (contrast material) has been injected into blood vessels in the area.  Ask your health care provider about changing or stopping your regular medicines before the procedure. This is especially important if you are taking diabetes medicines, blood thinners, or medicines to treat erectile dysfunction.  After the procedure, drink water or other fluids to wash (flush) the contrast material out of your body. This information is not intended to replace advice given to you by your health care provider. Make sure you discuss any questions you have with your health care provider. Document Released: 10/06/2008 Document Revised: 09/12/2016 Document Reviewed: 09/12/2016 Elsevier Interactive Patient Education  2017 [employer].

## 2018-07-04 NOTE — Progress Notes (Signed)
Cardiology Office Note  Date:  07/04/2018   ID:  Brian, Snow 1971-04-19, MRN [id number]  PCP:  Lawerance Cruel, MD  Cardiologist:   Skeet Latch, MD   Chief Complaint  Patient presents with  . Follow-up    3 months     History of Present Illness: [name] is a 47 y.o. male with OSA and Grave's disease here for follow up.  He was initially seen 10/2017 for the evaluation of shortness of breath and exertional chest pain.  At that appointment he also reported unintentional weight loss and night sweats.  He was found to be hyperthyroid.  His PCP started methimazole and we started metoprolol.  His chest pain was atypical but he was referred for ETT 10/2017 that was negative for ischemia.  He had a hypertensive response to exercise (229/41) and was noted to have frequent PACs and non-sustained atrial tachycardia.  He achieved 10.1 METS on a Bruce protocol.  [name] has been doing very well.  He is now hypothyroid his most recent TSH was 14.  His biggest complaint is that he is working on the third shift so he has difficulty with insomnia.  He has not had any chest pain or shortness of breath.  He is very physically active at work.  He walks around 5.5 miles daily.  He has no exertional symptoms. He also goes to the gym 2 or 3 times per week and does strength training.  He also has not experienced any palpitations lately.  He cannot remember the last time he had to take metoprolol.  He is not working on his diet and limits fried foods.  He is also eating a lot of fresh and organic foods.  He is frustrated that he seems to be gaining weight instead of losing weight.  He does occasionally have numbness and tingling in his left arm.  It occurs sometimes at rest but also with exertion such as walking with a treadmill or doing cardio.  There is no associated shortness of breath, nausea, or diaphoresis  Past Medical History:  Diagnosis Date  . Arthritis   . Atypical  chest pain 10/24/2017  . Atypical chest pain 10/24/2017  . Back pain   . GERD (gastroesophageal reflux disease)    occ  . Graves disease 12/29/2017  . Migraine   . Morbid obesity ([place]) 10/24/2017  . Neck pain   . Night sweats 10/24/2017  . Numbness and tingling in left arm   . Numbness and tingling in left hand   . OSA (obstructive sleep apnea) 10/24/2017  . Polyuria 10/24/2017  . Sleep apnea    cpap 1 yr  . Unintentional weight loss 10/24/2017    Past Surgical History:  Procedure Laterality Date  . Epidural Steroid Injections    . KNEE SURGERY Left    arthroscopy  . SHOULDER ARTHROSCOPY WITH DEBRIDEMENT AND BICEP TENDON REPAIR    . SHOULDER ARTHROSCOPY WITH SUBACROMIAL DECOMPRESSION, ROTATOR CUFF REPAIR AND BICEP TENDON REPAIR Right 04/09/2015   Procedure: RIGHT SHOULDER ARTHROSCOPY WITH SUBACROMIAL DECOMPRESSION, DISTAL  CLAVICLE RESECTION LABRAL AND ROTATOR CUFF DEBRIDEMENT;  Surgeon: Justice Britain, MD;  Location: [facility];  Service: Orthopedics;  Laterality: Right;  . SHOULDER SURGERY Left    arthroscopy     Current Outpatient Medications  Medication Sig Dispense Refill  . fluticasone (FLONASE) 50 MCG/ACT nasal spray Place 2 sprays into both nostrils as needed.     . Loratadine (CLARITIN PO) Take by  mouth.    . methimazole (TAPAZOLE) 10 MG tablet Take 10 mg by mouth daily.     . metoprolol tartrate (LOPRESSOR) 25 MG tablet Take 0.5-1 tablet daily as needed for palpitations 30 tablet 3  . Omega-3 Fatty Acids (FISH OIL PO) Take by mouth.     No current facility-administered medications for this visit.     Allergies:   Patient has no known allergies.    Social History:  The patient  reports that he quit smoking about 13 years ago. His smoking use included cigarettes. He has a 1.25 pack-year smoking history. He has quit using smokeless tobacco. He reports that he drinks alcohol. He reports that he does not use drugs.   Family History:  The patient's family history includes  Cancer in his mother; Diabetes in his maternal grandmother; Heart attack in his maternal grandfather; Hypertension in his brother, father, mother, and sister; Ovarian cancer in his mother; Stomach cancer in his father; Stroke in his maternal grandmother; Thyroid disease in his mother.    ROS:  Please see the history of present illness.   Otherwise, review of systems are positive for migraines.   All other systems are reviewed and negative.    PHYSICAL EXAM: VS:  BP 112/60 (BP Location: Left Arm, Patient Position: Sitting, Cuff Size: Large)   Pulse 69   Ht 6\' 5"  (1.956 m)   Wt (!) 322 lb (146.1 kg)   BMI 38.18 kg/m  , BMI Body mass index is 38.18 kg/m. GENERAL:  Well appearing HEENT: Pupils equal round and reactive, fundi not visualized, oral mucosa unremarkable NECK:  No jugular venous distention, waveform within normal limits, carotid upstroke brisk and symmetric, no bruits LUNGS:  Clear to auscultation bilaterally HEART:  RRR.  PMI not displaced or sustained,S1 and S2 within normal limits, no S3, no S4, no clicks, no rubs, no murmurs ABD:  Flat, positive bowel sounds normal in frequency in pitch, no bruits, no rebound, no guarding, no midline pulsatile mass, no hepatomegaly, no splenomegaly EXT:  2 plus pulses throughout, no edema, no cyanosis no clubbing SKIN:  No rashes no nodules NEURO:  Cranial nerves II through XII grossly intact, motor grossly intact throughout PSYCH:  Cognitively intact, oriented to person place and time   EKG:  EKG is not ordered today. 10/03/17: Sinus rhythm.  Rate 82 bpm.   Recent Labs: 10/24/2017: ALT 47; BUN 19; Creatinine, Ser 0.68; Hemoglobin 14.3; Platelets 253; Potassium 4.3; Sodium 137; TSH <0.006   06/13/17: WBC 6.0, hemoglobin 13.7, hematocrit 39.7, platelets 280 Sodium 140, potassium 4.1, BUN 18, creatinine 1.12 AST 42, ALT 24  02/22/17: Total cholesterol 161, triglycerides 77, HDL 52, LDL 94 TSH 0.81  Lipid Panel No results found for:  CHOL, TRIG, HDL, CHOLHDL, VLDL, LDLCALC, LDLDIRECT    Wt Readings from Last 3 Encounters:  07/04/18 (!) 322 lb (146.1 kg)  04/06/18 (!) 320 lb 9.6 oz (145.4 kg)  12/29/17 (!) 331 lb 6.4 oz (150.3 kg)      ASSESSMENT AND PLAN:  # Palpitations: Resolved.  His BP is running low.  Stop metoprolol.  This was all likely 2/2 OSA and Graves, both of which are being treated.   # Atypical chest pain: Resolved with treatment of Graves.  However he has exertional arm discomfort.  We will get a coronary CT-A to definitively assess for ischemia.   # OSA: Continue CPAP.  # Obesity: [name] was congratulated on his diet and exercise.    Current medicines  are reviewed at length with the patient today.  The patient does not have concerns regarding medicines.  The following changes have been made:  none  Labs/ tests ordered today include:   Orders Placed This Encounter  Procedures  . CT CORONARY MORPH W/CTA COR W/SCORE W/CA W/CM &/OR WO/CM  . CT CORONARY FRACTIONAL FLOW RESERVE DATA PREP  . CT CORONARY FRACTIONAL FLOW RESERVE FLUID ANALYSIS  . Basic metabolic panel     Disposition:   FU with Kristle Wesch C. Oval Linsey, MD, [facility] in 1 year.     Signed, Kiki Bivens C. Oval Linsey, MD, [facility]  07/04/2018 5:54 PM    [place]

## 2018-07-13 DIAGNOSIS — E0590 Thyrotoxicosis, unspecified without thyrotoxic crisis or storm: Secondary | ICD-10-CM | POA: Diagnosis not present

## 2018-07-16 DIAGNOSIS — R351 Nocturia: Secondary | ICD-10-CM | POA: Diagnosis not present

## 2018-07-16 DIAGNOSIS — N401 Enlarged prostate with lower urinary tract symptoms: Secondary | ICD-10-CM | POA: Diagnosis not present

## 2018-07-20 DIAGNOSIS — N401 Enlarged prostate with lower urinary tract symptoms: Secondary | ICD-10-CM | POA: Diagnosis not present

## 2018-08-02 DIAGNOSIS — Z01812 Encounter for preprocedural laboratory examination: Secondary | ICD-10-CM | POA: Diagnosis not present

## 2018-08-02 DIAGNOSIS — R072 Precordial pain: Secondary | ICD-10-CM | POA: Diagnosis not present

## 2018-08-02 LAB — BASIC METABOLIC PANEL
BUN/Creatinine Ratio: 16 (ref 9–20)
BUN: 16 mg/dL (ref 6–24)
CHLORIDE: 103 mmol/L (ref 96–106)
CO2: 22 mmol/L (ref 20–29)
CREATININE: 1.02 mg/dL (ref 0.76–1.27)
Calcium: 9.3 mg/dL (ref 8.7–10.2)
GFR calc Af Amer: 99 mL/min/{1.73_m2} (ref >59)
GFR calc non Af Amer: 86 mL/min/{1.73_m2} (ref >59)
GLUCOSE: 85 mg/dL (ref 65–99)
Potassium: 4.4 mmol/L (ref 3.5–5.2)
Sodium: 140 mmol/L (ref 134–144)

## 2018-08-09 ENCOUNTER — Ambulatory Visit (HOSPITAL_COMMUNITY)
Admission: RE | Admit: 2018-08-09 | Discharge: 2018-08-09 | Disposition: A | Discharge status: Home / Self Care | Payer: 59 | Source: Ambulatory Visit | Attending: Cardiovascular Disease | Admitting: Cardiovascular Disease

## 2018-08-09 ENCOUNTER — Encounter: Payer: Self-pay | Admitting: *Deleted

## 2018-08-09 DIAGNOSIS — R072 Precordial pain: Secondary | ICD-10-CM | POA: Diagnosis not present

## 2018-08-09 MED ORDER — NITROGLYCERIN 0.4 MG SL SUBL
0.8000 mg | SUBLINGUAL_TABLET | Freq: Once | SUBLINGUAL | Status: AC
  Administered 2018-08-09: 0.8 mg via SUBLINGUAL
  Filled 2018-08-09: qty 25

## 2018-08-09 MED ORDER — NITROGLYCERIN 0.4 MG SL SUBL
SUBLINGUAL_TABLET | SUBLINGUAL | Status: AC
  Filled 2018-08-09: qty 2

## 2018-08-09 MED ORDER — METOPROLOL TARTRATE 5 MG/5ML IV SOLN
INTRAVENOUS | Status: AC
  Filled 2018-08-09: qty 5

## 2018-08-09 MED ORDER — IOPAMIDOL (ISOVUE-370) INJECTION 76%
100.0000 mL | Freq: Once | INTRAVENOUS | Status: AC | PRN
Start: 2018-08-09 — End: 2018-08-09
  Administered 2018-08-09: 80 mL via INTRAVENOUS

## 2018-08-09 NOTE — Research (Signed)
CAD FEM Informed Consent           Subject Name:  [name]. Swing    Subject met inclusion and exclusion criteria.  The informed consent form, study requirements and expectations were reviewed with the subject and questions and concerns were addressed prior to the signing of the consent form.  The subject verbalized understanding of the trial requirements.  The subject agreed to participate in the CAD FEM trial and signed the informed consent.  The informed consent was obtained prior to performance of any protocol-specific procedures for the subject.  A copy of the signed informed consent was given to the subject and a copy was placed in the subject's medical record.   [place] Daje Stark, Research Assistant 08/09/2018  7:32

## 2018-08-20 DIAGNOSIS — E0590 Thyrotoxicosis, unspecified without thyrotoxic crisis or storm: Secondary | ICD-10-CM | POA: Diagnosis not present

## 2018-08-20 DIAGNOSIS — Z79899 Other long term (current) drug therapy: Secondary | ICD-10-CM | POA: Diagnosis not present

## 2018-09-13 DIAGNOSIS — R7989 Other specified abnormal findings of blood chemistry: Secondary | ICD-10-CM | POA: Diagnosis not present

## 2018-09-13 DIAGNOSIS — E0590 Thyrotoxicosis, unspecified without thyrotoxic crisis or storm: Secondary | ICD-10-CM | POA: Diagnosis not present

## 2018-09-13 DIAGNOSIS — Z23 Encounter for immunization: Secondary | ICD-10-CM | POA: Diagnosis not present

## 2018-09-13 DIAGNOSIS — Z5181 Encounter for therapeutic drug level monitoring: Secondary | ICD-10-CM | POA: Diagnosis not present

## 2018-10-12 DIAGNOSIS — R51 Headache: Secondary | ICD-10-CM | POA: Diagnosis not present

## 2018-10-12 DIAGNOSIS — R252 Cramp and spasm: Secondary | ICD-10-CM | POA: Diagnosis not present

## 2018-10-12 DIAGNOSIS — M25521 Pain in right elbow: Secondary | ICD-10-CM | POA: Diagnosis not present

## 2018-10-12 DIAGNOSIS — Z87898 Personal history of other specified conditions: Secondary | ICD-10-CM | POA: Diagnosis not present

## 2018-10-16 DIAGNOSIS — E0590 Thyrotoxicosis, unspecified without thyrotoxic crisis or storm: Secondary | ICD-10-CM | POA: Diagnosis not present

## 2018-10-19 DIAGNOSIS — R7989 Other specified abnormal findings of blood chemistry: Secondary | ICD-10-CM | POA: Diagnosis not present

## 2018-10-19 DIAGNOSIS — Z5181 Encounter for therapeutic drug level monitoring: Secondary | ICD-10-CM | POA: Diagnosis not present

## 2018-10-19 DIAGNOSIS — E0590 Thyrotoxicosis, unspecified without thyrotoxic crisis or storm: Secondary | ICD-10-CM | POA: Diagnosis not present

## 2018-11-01 DIAGNOSIS — G4733 Obstructive sleep apnea (adult) (pediatric): Secondary | ICD-10-CM | POA: Diagnosis not present

## 2018-11-19 DIAGNOSIS — E0590 Thyrotoxicosis, unspecified without thyrotoxic crisis or storm: Secondary | ICD-10-CM | POA: Diagnosis not present

## 2018-12-14 DIAGNOSIS — E0590 Thyrotoxicosis, unspecified without thyrotoxic crisis or storm: Secondary | ICD-10-CM | POA: Diagnosis not present

## 2018-12-14 DIAGNOSIS — Z5181 Encounter for therapeutic drug level monitoring: Secondary | ICD-10-CM | POA: Diagnosis not present

## 2018-12-14 DIAGNOSIS — E0500 Thyrotoxicosis with diffuse goiter without thyrotoxic crisis or storm: Secondary | ICD-10-CM | POA: Diagnosis not present

## 2019-02-11 DIAGNOSIS — E0590 Thyrotoxicosis, unspecified without thyrotoxic crisis or storm: Secondary | ICD-10-CM | POA: Diagnosis not present

## 2020-07-02 ENCOUNTER — Institutional Professional Consult (permissible substitution): Payer: 59 | Admitting: Neurology

## 2020-07-11 ENCOUNTER — Other Ambulatory Visit (HOSPITAL_COMMUNITY): Payer: Self-pay | Admitting: Nurse Practitioner

## 2020-07-11 ENCOUNTER — Telehealth: Payer: Self-pay | Admitting: Infectious Diseases

## 2020-07-11 DIAGNOSIS — U071 COVID-19: Secondary | ICD-10-CM

## 2020-07-11 NOTE — Progress Notes (Signed)
I connected by phone with [name] on 07/11/2020 at 2:04 PM to discuss the potential use of an new treatment for mild to moderate COVID-19 viral infection in non-hospitalized patients.  This patient is a 49 y.o. male that meets the FDA criteria for Emergency Use Authorization of casirivimab\imdevimab.  Has a (+) direct SARS-CoV-2 viral test result  Has mild or moderate COVID-19   Is ? 51 years of age and weighs ? 40 kg  Is NOT hospitalized due to COVID-19  Is NOT requiring oxygen therapy or requiring an increase in baseline oxygen flow rate due to COVID-19  Is within 10 days of symptom onset  Has at least one of the high risk factor(s) for progression to severe COVID-19 and/or hospitalization as defined in EUA.  Specific high risk criteria : BMI > 25   Sx onset 8/29.    I have spoken and communicated the following to the patient or parent/caregiver:  1. FDA has authorized the emergency use of casirivimab\imdevimab for the treatment of mild to moderate COVID-19 in adults and pediatric patients with positive results of direct SARS-CoV-2 viral testing who are 20 years of age and older weighing at least 40 kg, and who are at high risk for progressing to severe COVID-19 and/or hospitalization.  2. The significant known and potential risks and benefits of casirivimab\imdevimab, and the extent to which such potential risks and benefits are unknown.  3. Information on available alternative treatments and the risks and benefits of those alternatives, including clinical trials.  4. Patients treated with casirivimab\imdevimab should continue to self-isolate and use infection control measures (e.g., wear mask, isolate, social distance, avoid sharing personal items, clean and disinfect "high touch" surfaces, and frequent handwashing) according to CDC guidelines.   5. The patient or parent/caregiver has the option to accept or refuse casirivimab\imdevimab .  After reviewing this  information with the patient, The patient agreed to proceed with receiving casirivimab\imdevimab infusion and will be provided a copy of the Fact sheet prior to receiving the infusion.Beckey Rutter, [place], AGNP-C [phone] ([place])

## 2020-07-11 NOTE — Telephone Encounter (Signed)
Duplicate

## 2020-07-12 ENCOUNTER — Ambulatory Visit (HOSPITAL_COMMUNITY)
Admission: RE | Admit: 2020-07-12 | Discharge: 2020-07-12 | Disposition: A | Discharge status: Home / Self Care | Payer: 59 | Source: Ambulatory Visit | Attending: Pulmonary Disease | Admitting: Pulmonary Disease

## 2020-07-12 DIAGNOSIS — U071 COVID-19: Secondary | ICD-10-CM | POA: Insufficient documentation

## 2020-07-12 MED ORDER — METHYLPREDNISOLONE SODIUM SUCC 125 MG IJ SOLR: 125.0000 mg | Freq: Once | INTRAMUSCULAR | Status: DC | PRN

## 2020-07-12 MED ORDER — EPINEPHRINE 0.3 MG/0.3ML IJ SOAJ: 0.3000 mg | Freq: Once | INTRAMUSCULAR | Status: DC | PRN

## 2020-07-12 MED ORDER — SODIUM CHLORIDE 0.9 % IV SOLN: INTRAVENOUS | Status: DC | PRN

## 2020-07-12 MED ORDER — DIPHENHYDRAMINE HCL 50 MG/ML IJ SOLN: 50.0000 mg | Freq: Once | INTRAMUSCULAR | Status: DC | PRN

## 2020-07-12 MED ORDER — FAMOTIDINE IN NACL 20-0.9 MG/50ML-% IV SOLN: 20.0000 mg | Freq: Once | INTRAVENOUS | Status: DC | PRN

## 2020-07-12 MED ORDER — SODIUM CHLORIDE 0.9 % IV SOLN
1200.0000 mg | Freq: Once | INTRAVENOUS | Status: AC
  Administered 2020-07-12: 1200 mg via INTRAVENOUS
  Filled 2020-07-12: qty 10

## 2020-07-12 MED ORDER — ALBUTEROL SULFATE HFA 108 (90 BASE) MCG/ACT IN AERS: 2.0000 | INHALATION_SPRAY | Freq: Once | RESPIRATORY_TRACT | Status: DC | PRN

## 2020-07-12 NOTE — Discharge Instructions (Signed)

## 2020-07-12 NOTE — Progress Notes (Signed)
  Diagnosis: COVID-19  Physician: Wright, MD  Procedure: [facility] Med: casirivimab\imdevimab infusion - Provided patient with casirivimab\imdevimab fact sheet for patients, parents and caregivers prior to infusion.  Complications: No immediate complications noted.  Discharge: Discharged home   Akhil Piscopo R Karyl Sharrar 07/12/2020   

## 2020-08-11 ENCOUNTER — Ambulatory Visit: Payer: 59 | Admitting: Neurology

## 2020-08-11 ENCOUNTER — Encounter: Payer: Self-pay | Admitting: Neurology

## 2020-08-11 ENCOUNTER — Other Ambulatory Visit: Payer: Self-pay

## 2020-08-11 VITALS — BP 127/70 | HR 67 | Ht 77.0 in | Wt 348.0 lb

## 2020-08-11 DIAGNOSIS — G43709 Chronic migraine without aura, not intractable, without status migrainosus: Secondary | ICD-10-CM

## 2020-08-11 MED ORDER — VENLAFAXINE HCL ER 75 MG PO CP24: 75.0000 mg | ORAL_CAPSULE | Freq: Every day | ORAL | 11 refills | Status: DC

## 2020-08-11 MED ORDER — SUMATRIPTAN SUCCINATE 100 MG PO TABS: 100.0000 mg | ORAL_TABLET | Freq: Once | ORAL | 11 refills | Status: DC | PRN

## 2020-08-11 NOTE — Progress Notes (Signed)
Chief Complaint  Patient presents with  . New Patient (Initial Visit)    RM 14, alone. Paper referral from Lona Kettle, MD for migraines. Has seen Dr. Krista Blue in 2016 for cervical/lumbar radiculopathy.  Marland Kitchen PCP    Lona Kettle, MD  . Migraine    Has had them for years. Started in 1993 when he got out of the [employer]. He is not sleeping well. He has OSA/uses CPAP machine (has been on this over 5 years). Recently got a new machine, last sleep study less than a year ago (Dr. Glade Nurse office). Averaging 2-4 hours sleep per night still.     HISTORICAL  [name] is a 49 year old male, seen in request by his primary care physician Dr. Harrington Challenger, Dwyane Luo, for evaluation of migraine headaches, initial evaluation was August 12, 2019,  I reviewed and summarized the referring note. Chronic migraine OSA CPAP machine HTN Morbid obesity  He had a history of chronic migraine for many years, become more frequent in recent years, about 1-2 times every week, usually starting at the frontal region, pressure, move around to different spot, and headache he also has nausea, despite medications,  lasting about 4 to 5 hours, is usually associated with hungry, dehydration will trigger the headache  He has tried Excedrin Migraine with limited help,  This migraine started at age 71, he also complains of loud snoring, poor sleep quality, complains of depression, anxiety,  REVIEW OF SYSTEMS: Full 14 system review of systems performed and notable only for as above All other review of systems were negative.  ALLERGIES: No Known Allergies  HOME MEDICATIONS: Current Outpatient Medications  Medication Sig Dispense Refill  . fluticasone (FLONASE) 50 MCG/ACT nasal spray Place 2 sprays into both nostrils as needed.     . Loratadine (CLARITIN PO) Take by mouth.    . methimazole (TAPAZOLE) 10 MG tablet Take 10 mg by mouth daily.     . metoprolol tartrate (LOPRESSOR) 25 MG tablet Take 0.5-1 tablet daily as  needed for palpitations 30 tablet 3  . Omega-3 Fatty Acids (FISH OIL PO) Take by mouth.     No current facility-administered medications for this visit.    PAST MEDICAL HISTORY: Past Medical History:  Diagnosis Date  . Arthritis   . Atypical chest pain 10/24/2017  . Atypical chest pain 10/24/2017  . Back pain   . GERD (gastroesophageal reflux disease)    occ  . Graves disease 12/29/2017  . Migraine   . Morbid obesity ([place]) 10/24/2017  . Neck pain   . Night sweats 10/24/2017  . Numbness and tingling in left arm   . Numbness and tingling in left hand   . OSA (obstructive sleep apnea) 10/24/2017  . Polyuria 10/24/2017  . Sleep apnea    cpap 1 yr  . Unintentional weight loss 10/24/2017    PAST SURGICAL HISTORY: Past Surgical History:  Procedure Laterality Date  . Epidural Steroid Injections    . KNEE SURGERY Left    arthroscopy  . SHOULDER ARTHROSCOPY WITH DEBRIDEMENT AND BICEP TENDON REPAIR    . SHOULDER ARTHROSCOPY WITH SUBACROMIAL DECOMPRESSION, ROTATOR CUFF REPAIR AND BICEP TENDON REPAIR Right 04/09/2015   Procedure: RIGHT SHOULDER ARTHROSCOPY WITH SUBACROMIAL DECOMPRESSION, DISTAL  CLAVICLE RESECTION LABRAL AND ROTATOR CUFF DEBRIDEMENT;  Surgeon: Justice Britain, MD;  Location: [place];  Service: Orthopedics;  Laterality: Right;  . SHOULDER SURGERY Left    arthroscopy    FAMILY HISTORY: Family History  Problem Relation Age of Onset  .  Hypertension Mother   . Ovarian cancer Mother   . Cancer Mother        ovarian  . Thyroid disease Mother   . Hypertension Father   . Stomach cancer Father   . Hypertension Sister   . Hypertension Brother   . Stroke Maternal Grandmother   . Diabetes Maternal Grandmother   . Heart attack Maternal Grandfather     SOCIAL HISTORY: Social History   Socioeconomic History  . Marital status: Married    Spouse name: Not on file  . Number of children: 2  . Years of education: College   . Highest education level: Not on file   Occupational History  . Occupation: General Floor Help - [employer]  Tobacco Use  . Smoking status: Former Smoker    Packs/day: 0.25    Years: 5.00    Pack years: 1.25    Types: Cigarettes    Quit date: 03/29/2005    Years since quitting: 15.3  . Smokeless tobacco: Former Network engineer  . Vaping Use: Never used  Substance and Sexual Activity  . Alcohol use: Yes    Alcohol/week: 0.0 standard drinks    Comment: Social use - rare  . Drug use: No  . Sexual activity: Not on file  Other Topics Concern  . Not on file  Social History Narrative   Lives with wife and two children.   Right-handed.   Occasional use of caffeine- soda   Social Determinants of Health   Financial Resource Strain:   . Difficulty of Paying Living Expenses: Not on file  Food Insecurity:   . Worried About Charity fundraiser in the Last Year: Not on file  . Ran Out of Food in the Last Year: Not on file  Transportation Needs:   . Lack of Transportation (Medical): Not on file  . Lack of Transportation (Non-Medical): Not on file  Physical Activity:   . Days of Exercise per Week: Not on file  . Minutes of Exercise per Session: Not on file  Stress:   . Feeling of Stress : Not on file  Social Connections:   . Frequency of Communication with Friends and Family: Not on file  . Frequency of Social Gatherings with Friends and Family: Not on file  . Attends Religious Services: Not on file  . Active Member of Clubs or Organizations: Not on file  . Attends Archivist Meetings: Not on file  . Marital Status: Not on file  Intimate Partner Violence:   . Fear of Current or Ex-Partner: Not on file  . Emotionally Abused: Not on file  . Physically Abused: Not on file  . Sexually Abused: Not on file     PHYSICAL EXAM   Vitals:   08/11/20 1018  BP: 127/70  Pulse: 67  Weight: (!) 348 lb (157.9 kg)  Height: 6\' 5"  (1.956 m)   Not recorded     Body mass index is 41.27 kg/m.  PHYSICAL  EXAMNIATION:  Gen: NAD, conversant, well nourised, well groomed                     Cardiovascular: Regular rate rhythm, no peripheral edema, warm, nontender. Eyes: Conjunctivae clear without exudates or hemorrhage Neck: Supple, no carotid bruits. Pulmonary: Clear to auscultation bilaterally   NEUROLOGICAL EXAM:  MENTAL STATUS: Speech:    Speech is normal; fluent and spontaneous with normal comprehension.  Cognition:     Orientation to time, place and person  Normal recent and remote memory     Normal Attention span and concentration     Normal Language, naming, repeating,spontaneous speech     Fund of knowledge   CRANIAL NERVES: CN II: Visual fields are full to confrontation. Pupils are round equal and briskly reactive to light. CN III, IV, VI: extraocular movement are normal. No ptosis. CN V: Facial sensation is intact to light touch CN VII: Face is symmetric with normal eye closure  CN VIII: Hearing is normal to causal conversation. CN IX, X: Phonation is normal. CN XI: Head turning and shoulder shrug are intact  MOTOR: There is no pronator drift of out-stretched arms. Muscle bulk and tone are normal. Muscle strength is normal.  REFLEXES: Reflexes are 2+ and symmetric at the biceps, triceps, knees, and ankles. Plantar responses are flexor.  SENSORY: Intact to light touch, pinprick and vibratory sensation are intact in fingers and toes.  COORDINATION: There is no trunk or limb dysmetria noted.  GAIT/STANCE: Posture is normal. Gait is steady with normal steps  DIAGNOSTIC DATA (LABS, IMAGING, TESTING) - I reviewed patient records, labs, notes, testing and imaging myself where available.   ASSESSMENT AND PLAN  [name] is a 49 y.o. male    Chronic migraine  Start preventive medication Effexor XR 75 mg daily  [name] metoprolol 25 mg as needed  Imitrex 100 mg as needed  Marcial Pacas, M.D. Ph.D.  [facility] [address], [place], [place] [address] Ph: [phone] Fax: [phone]  CC:  Lawerance Cruel, [place],  [place] [address]

## 2021-02-09 NOTE — Progress Notes (Signed)
Chief Complaint  Patient presents with  . Follow-up    New rm, alone, reports migraines are worse, 3-4 a month     HISTORICAL  [name] is a 50 year old male, seen in request by his primary care physician Dr. Harrington Challenger, Dwyane Luo, for evaluation of migraine headaches, initial evaluation was August 12, 2019,  I reviewed and summarized the referring note. Chronic migraine OSA CPAP machine HTN Morbid obesity  He had a history of chronic migraine for many years, become more frequent in recent years, about 1-2 times every week, usually starting at the frontal region, pressure, move around to different spot, and headache he also has nausea, despite medications,  lasting about 4 to 5 hours, is usually associated with hungry, dehydration will trigger the headache  He has tried Excedrin Migraine with limited help,  This migraine started at age 5, he also complains of loud snoring, poor sleep quality, complains of depression, anxiety  Update February 10, 2021 SS: Continues with migraine headaches, on average 1 a week, could not tolerate Effexor, made him feel anxious, was not helpful for the headaches.  He had a significant migraine, had to go to the ER in January.  Migraines are typically retro-orbital bilaterally, photophobia, phonophobia, nausea, vomiting.  He has FMLA, often has to miss work or go home.  History of Graves' disease, well controlled, has been taken off medications.  He is working out.  His daughter is getting married on Saturday.  He has a 27-year-old son.  Sees ophthalmology, since his Graves' disease, he has had blurry vision.  Gets Fioricet from PCP, usually works well.  Imitrex was not helpful.  REVIEW OF SYSTEMS: Full 14 system review of systems performed and notable only for as above  Headache  ALLERGIES: No Known Allergies  HOME MEDICATIONS: Current Outpatient Medications  Medication Sig Dispense Refill  . fluticasone (FLONASE) 50 MCG/ACT nasal spray Place  2 sprays into both nostrils as needed.     . Loratadine (CLARITIN PO) Take by mouth.    . nortriptyline (PAMELOR) 10 MG capsule Take 1 at bedtime for 1 weeks, then take 2 at bedtime for 1 week, then take 3 tablets at bedtime, stay at that dose 90 capsule 5  . ondansetron (ZOFRAN) 4 MG tablet Take 1 tablet (4 mg total) by mouth every 8 (eight) hours as needed for nausea or vomiting. 20 tablet 3  . SUMAtriptan (IMITREX) 100 MG tablet Take 1 tablet (100 mg total) by mouth once as needed for up to 1 dose for migraine. May repeat in 2 hours if headache persists or recurs. 12 tablet 11  . tiZANidine (ZANAFLEX) 4 MG tablet Take 1 tablet (4 mg total) by mouth every 6 (six) hours as needed for muscle spasms. 30 tablet 3   No current facility-administered medications for this visit.    PAST MEDICAL HISTORY: Past Medical History:  Diagnosis Date  . Arthritis   . Atypical chest pain 10/24/2017  . Atypical chest pain 10/24/2017  . Back pain   . GERD (gastroesophageal reflux disease)    occ  . Graves disease 12/29/2017  . Migraine   . Morbid obesity ([place]) 10/24/2017  . Neck pain   . Night sweats 10/24/2017  . Numbness and tingling in left arm   . Numbness and tingling in left hand   . OSA (obstructive sleep apnea) 10/24/2017  . Polyuria 10/24/2017  . Sleep apnea    cpap 1 yr  . Unintentional weight loss 10/24/2017  PAST SURGICAL HISTORY: Past Surgical History:  Procedure Laterality Date  . Epidural Steroid Injections    . KNEE SURGERY Left    arthroscopy  . SHOULDER ARTHROSCOPY WITH DEBRIDEMENT AND BICEP TENDON REPAIR    . SHOULDER ARTHROSCOPY WITH SUBACROMIAL DECOMPRESSION, ROTATOR CUFF REPAIR AND BICEP TENDON REPAIR Right 04/09/2015   Procedure: RIGHT SHOULDER ARTHROSCOPY WITH SUBACROMIAL DECOMPRESSION, DISTAL  CLAVICLE RESECTION LABRAL AND ROTATOR CUFF DEBRIDEMENT;  Surgeon: Justice Britain, MD;  Location: [place];  Service: Orthopedics;  Laterality: Right;  . SHOULDER SURGERY Left     arthroscopy    FAMILY HISTORY: Family History  Problem Relation Age of Onset  . Hypertension Mother   . Ovarian cancer Mother   . Cancer Mother        ovarian  . Thyroid disease Mother   . Hypertension Father   . Stomach cancer Father   . Hypertension Sister   . Hypertension Brother   . Stroke Maternal Grandmother   . Diabetes Maternal Grandmother   . Heart attack Maternal Grandfather     SOCIAL HISTORY: Social History   Socioeconomic History  . Marital status: Married    Spouse name: Not on file  . Number of children: 2  . Years of education: College   . Highest education level: Not on file  Occupational History  . Occupation: General Floor Help - [employer]  Tobacco Use  . Smoking status: Former Smoker    Packs/day: 0.25    Years: 5.00    Pack years: 1.25    Types: Cigarettes    Quit date: 03/29/2005    Years since quitting: 15.8  . Smokeless tobacco: Former Network engineer  . Vaping Use: Never used  Substance and Sexual Activity  . Alcohol use: Yes    Alcohol/week: 0.0 standard drinks    Comment: Social use - rare  . Drug use: No  . Sexual activity: Not on file  Other Topics Concern  . Not on file  Social History Narrative   Lives with wife and two children.   Right-handed.   Occasional use of caffeine- soda   Social Determinants of Health   Financial Resource Strain: Not on file  Food Insecurity: Not on file  Transportation Needs: Not on file  Physical Activity: Not on file  Stress: Not on file  Social Connections: Not on file  Intimate Partner Violence: Not on file     PHYSICAL EXAM   Vitals:   02/10/21 1049  BP: 123/75  Pulse: 74  Weight: (!) 343 lb (155.6 kg)  Height: 6\' 5"  (1.956 m)   Not recorded     Body mass index is 40.67 kg/m.  PHYSICAL EXAMNIATION:  Gen: NAD, conversant, well nourised, well groomed                     Cardiovascular: Regular rate rhythm, no peripheral edema, warm, nontender. Eyes: Conjunctivae clear  without exudates or hemorrhage Pulmonary: Clear to auscultation bilaterally   NEUROLOGICAL EXAM:  MENTAL STATUS: Speech:    Speech is normal; fluent and spontaneous with normal comprehension.  Cognition:     Orientation to time, place and person     Normal recent and remote memory     Normal Attention span and concentration     Normal Language, naming, repeating,spontaneous speech     Fund of knowledge   CRANIAL NERVES: CN II: Visual fields are full to confrontation. Pupils are round equal and briskly reactive to light. CN III, IV,  VI: extraocular movement are normal. No ptosis. CN V: Facial sensation is intact to light touch CN VII: Face is symmetric with normal eye closure  CN VIII: Hearing is normal to causal conversation. CN IX, X: Phonation is normal. CN XI: Head turning and shoulder shrug are intact  MOTOR: Good strength all extremities  REFLEXES: 2+ reflexes throughout  SENSORY: Intact to soft touch  COORDINATION: Finger-nose-finger and heel-to-shin is normal  GAIT/STANCE: Gait is normal  DIAGNOSTIC DATA (LABS, IMAGING, TESTING) - I reviewed patient records, labs, notes, testing and imaging myself where available.   ASSESSMENT AND PLAN  [name] is a 50 y.o. male    1. Chronic migraine -On average 1 migraine a week, but is significant -Could not tolerate Effexor, has previously been on metoprolol PRN for palpitations from Graves' disease, reported side effect from Topamax -Will add on nortriptyline working up to 30 mg at bedtime, does not sleep well, hopefully will help insomnia -For acute headache, can continue Fioricet from PCP, can combine with tizanidine, Zofran, and Aleve for prolonged headache -Indicates lack of benefit with Imitrex -If nortriptyline is not helpful, would consider Zonegran, worried Depakote may result in weight gain -Encouraged to set up MyChart account to send progress report, follow-up in 3 months or sooner if  needed  I spent 30 minutes of face-to-face and non-face-to-face time with patient.  This included previsit chart review, lab review, study review, order entry, electronic health record documentation, patient education.  Evangeline Dakin, DNP  [facility] [address], [place], [place] [address] [phone]

## 2021-02-10 ENCOUNTER — Encounter: Payer: Self-pay | Admitting: Neurology

## 2021-02-10 ENCOUNTER — Ambulatory Visit: Payer: 59 | Admitting: Neurology

## 2021-02-10 ENCOUNTER — Other Ambulatory Visit: Payer: Self-pay

## 2021-02-10 VITALS — BP 123/75 | HR 74 | Ht 77.0 in | Wt 343.0 lb

## 2021-02-10 DIAGNOSIS — G43709 Chronic migraine without aura, not intractable, without status migrainosus: Secondary | ICD-10-CM

## 2021-02-10 MED ORDER — NORTRIPTYLINE HCL 10 MG PO CAPS: ORAL_CAPSULE | ORAL | 5 refills | Status: DC

## 2021-02-10 MED ORDER — ONDANSETRON HCL 4 MG PO TABS: 4.0000 mg | ORAL_TABLET | Freq: Three times a day (TID) | ORAL | 3 refills | Status: DC | PRN

## 2021-02-10 MED ORDER — TIZANIDINE HCL 4 MG PO TABS: 4.0000 mg | ORAL_TABLET | Freq: Four times a day (QID) | ORAL | 3 refills | Status: DC | PRN

## 2021-02-10 NOTE — Patient Instructions (Addendum)
Start the nortriptyline for migraine prevention Start taking 1 tablet at bedtime for 1 week, then take 2 for 1 week, then take 3 at bedtime, stay at this dose  For acute headache, take Fioricet, can combine with tizanidine, Aleve, Zofran  See you back in 3-4 months   Nortriptyline capsules What is this medicine? NORTRIPTYLINE (nor TRIP ti leen) is used to treat depression. This medicine may be used for other purposes; ask your health care provider or pharmacist if you have questions. COMMON BRAND NAME(S): Aventyl, Pamelor What should I tell my health care provider before I take this medicine? They need to know if you have any of these conditions:  bipolar disorder  Brugada syndrome  difficulty passing urine  glaucoma  heart disease  if you drink alcohol  liver disease  schizophrenia  seizures  suicidal thoughts, plans or attempt; a previous suicide attempt by you or a family member  thyroid disease  an unusual or allergic reaction to nortriptyline, other tricyclic antidepressants, other medicines, foods, dyes, or preservatives  pregnant or trying to get pregnant  breast-feeding How should I use this medicine? Take this medicine by mouth with a glass of water. Follow the directions on the prescription label. Take your doses at regular intervals. Do not take it more often than directed. Do not stop taking this medicine suddenly except upon the advice of your doctor. Stopping this medicine too quickly may cause serious side effects or your condition may worsen. A special MedGuide will be given to you by the pharmacist with each prescription and refill. Be sure to read this information carefully each time. Talk to your pediatrician regarding the use of this medicine in children. Special care may be needed. Overdosage: If you think you have taken too much of this medicine contact a poison control center or emergency room at once. NOTE: This medicine is only for you. Do not  share this medicine with others. What if I miss a dose? If you miss a dose, take it as soon as you can. If it is almost time for your next dose, take only that dose. Do not take double or extra doses. What may interact with this medicine? Do not take this medicine with any of the following medications:  cisapride  dronedarone  linezolid  MAOIs like Carbex, Eldepryl, Marplan, Nardil, and Parnate  methylene blue (injected into a vein)  pimozide  thioridazine This medicine may also interact with the following medications:  alcohol  antihistamines for allergy, cough, and cold  atropine  certain medicines for bladder problems like oxybutynin, tolterodine  certain medicines for depression like amitriptyline, fluoxetine, sertraline  certain medicines for Parkinson's disease like benztropine, trihexyphenidyl  certain medicines for stomach problems like dicyclomine, hyoscyamine  certain medicines for travel sickness like scopolamine  chlorpropamide  cimetidine  ipratropium  other medicines that prolong the QT interval (an abnormal heart rhythm) like dofetilide  other medicines that can cause serotonin syndrome like St. John's Wort, fentanyl, lithium, tramadol, tryptophan, buspirone, and some medicines for headaches like sumatriptan or rizatriptan  quinidine  reserpine  thyroid medicine This list may not describe all possible interactions. Give your health care provider a list of all the medicines, herbs, non-prescription drugs, or dietary supplements you use. Also tell them if you smoke, drink alcohol, or use illegal drugs. Some items may interact with your medicine. What should I watch for while using this medicine? Tell your doctor if your symptoms do not get better or if they get worse.  Visit your doctor or health care professional for regular checks on your progress. Because it may take several weeks to see the full effects of this medicine, it is important to  continue your treatment as prescribed by your doctor. Patients and their families should watch out for new or worsening thoughts of suicide or depression. Also watch out for sudden changes in feelings such as feeling anxious, agitated, panicky, irritable, hostile, aggressive, impulsive, severely restless, overly excited and hyperactive, or not being able to sleep. If this happens, especially at the beginning of treatment or after a change in dose, call your health care professional. Dennis Bast may get drowsy or dizzy. Do not drive, use machinery, or do anything that needs mental alertness until you know how this medicine affects you. Do not stand or sit up quickly, especially if you are an older patient. This reduces the risk of dizzy or fainting spells. Alcohol may interfere with the effect of this medicine. Avoid alcoholic drinks. Do not treat yourself for coughs, colds, or allergies without asking your doctor or health care professional for advice. Some ingredients can increase possible side effects. Your mouth may get dry. Chewing sugarless gum or sucking hard candy, and drinking plenty of water may help. Contact your doctor if the problem does not go away or is severe. This medicine may cause dry eyes and blurred vision. If you wear contact lenses you may feel some discomfort. Lubricating drops may help. See your eye doctor if the problem does not go away or is severe. This medicine can cause constipation. Try to have a bowel movement at least every 2 to 3 days. If you do not have a bowel movement for 3 days, call your doctor or health care professional. This medicine can make you more sensitive to the sun. Keep out of the sun. If you cannot avoid being in the sun, wear protective clothing and use sunscreen. Do not use sun lamps or tanning beds/booths. What side effects may I notice from receiving this medicine? Side effects that you should report to your doctor or health care professional as soon as  possible:  allergic reactions like skin rash, itching or hives, swelling of the face, lips, or tongue  anxious  breathing problems  changes in vision  confusion  elevated mood, decreased need for sleep, racing thoughts, impulsive behavior  eye pain  fast, irregular heartbeat  feeling faint or lightheaded, falls  feeling agitated, angry, or irritable  fever with increased sweating  hallucination, loss of contact with reality  seizures  stiff muscles  suicidal thoughts or other mood changes  tingling, pain, or numbness in the feet or hands  trouble passing urine or change in the amount of urine  trouble sleeping  unusually weak or tired  vomiting  yellowing of the eyes or skin Side effects that usually do not require medical attention (report to your doctor or health care professional if they continue or are bothersome):  change in sex drive or performance  change in appetite or weight  constipation  dizziness  dry mouth  nausea  tired  tremors  upset stomach This list may not describe all possible side effects. Call your doctor for medical advice about side effects. You may report side effects to FDA at 1-800-FDA-1088. Where should I keep my medicine? Keep out of the reach of children. Store at room temperature between 15 and 30 degrees C (59 and 86 degrees F). Keep container tightly closed. Throw away any unused medicine after the  expiration date. NOTE: This sheet is a summary. It may not cover all possible information. If you have questions about this medicine, talk to your doctor, pharmacist, or health care provider.  2021 Elsevier/Gold Standard (2020-06-09 15:25:34)

## 2021-03-08 ENCOUNTER — Encounter: Payer: Self-pay | Admitting: Cardiovascular Disease

## 2021-03-08 ENCOUNTER — Other Ambulatory Visit: Payer: Self-pay

## 2021-03-08 ENCOUNTER — Ambulatory Visit: Payer: 59 | Admitting: Cardiovascular Disease

## 2021-03-08 VITALS — BP 122/74 | HR 60 | Ht 77.0 in | Wt 345.8 lb

## 2021-03-08 DIAGNOSIS — G4733 Obstructive sleep apnea (adult) (pediatric): Secondary | ICD-10-CM

## 2021-03-08 DIAGNOSIS — R0789 Other chest pain: Secondary | ICD-10-CM | POA: Diagnosis not present

## 2021-03-08 DIAGNOSIS — R000 Tachycardia, unspecified: Secondary | ICD-10-CM

## 2021-03-08 DIAGNOSIS — E0500 Thyrotoxicosis with diffuse goiter without thyrotoxic crisis or storm: Secondary | ICD-10-CM | POA: Diagnosis not present

## 2021-03-08 DIAGNOSIS — Z6841 Body Mass Index (BMI) 40.0 and over, adult: Secondary | ICD-10-CM

## 2021-03-08 NOTE — Patient Instructions (Addendum)
Medication Instructions:  Your physician recommends that you continue on your current medications as directed. Please refer to the Current Medication list given to you today.   *If you need a refill on your cardiac medications before your next appointment, please call your pharmacy*  Lab Work: NONE   Testing/Procedures: NONE   Follow-Up: AS NEEDED   You have been referred to    Where: [facility] Address: [address] [place] [address] Phone: [phone]  IF YOU DO NOT HEAR FROM THEM IN 2 WEEKS YOU CAN CALL THEM DIRECTLY AT [facility]

## 2021-03-08 NOTE — Progress Notes (Signed)
Cardiology Office Note  Date:  03/08/2021   ID:  Brian, Snow 08-21-1971, MRN [id number]  PCP:  Lawerance Cruel, MD  Cardiologist:   Skeet Latch, MD   No chief complaint on file.    History of Present Illness: [name] is a 50 y.o. male with OSA and Grave's disease here for follow up.  He was initially seen 10/2017 for the evaluation of shortness of breath and exertional chest pain.  At that appointment he also reported unintentional weight loss and night sweats.  He was found to be hyperthyroid.  His PCP started methimazole and we started metoprolol.  His chest pain was atypical but he was referred for ETT 10/2017 that was negative for ischemia.  He had a hypertensive response to exercise (229/41) and was noted to have frequent PACs and non-sustained atrial tachycardia.  He achieved 10.1 METS on a Bruce protocol. He had a coronary CTA 08/2018 that revealed normal coronary arteries and no CAD.    [name] has been exercising with boxing and golfing.  He has been able to lose almost 20 pounds so far.  He has also been working on his diet.  He is eating a lot of chicken and vegetables.  He denies any exertional chest pain but does sometimes feel tightness in his chest with stretching.  He has been under a lot of stress lately from his father passing and having to pay for the funeral.  His daughter also got married 3 weeks ago.  He notes that he sometimes gets tightness in his chest when he is feeling stressed or anxious.  He has been working with the therapist.  He struggles with some PTSD from his time in [place].   Past Medical History:  Diagnosis Date  . Arthritis   . Atypical chest pain 10/24/2017  . Atypical chest pain 10/24/2017  . Back pain   . GERD (gastroesophageal reflux disease)    occ  . Graves disease 12/29/2017  . Migraine   . Morbid obesity ([place]) 10/24/2017  . Neck pain   . Night sweats 10/24/2017  . Numbness and tingling in left  arm   . Numbness and tingling in left hand   . OSA (obstructive sleep apnea) 10/24/2017  . Polyuria 10/24/2017  . Sleep apnea    cpap 1 yr  . Unintentional weight loss 10/24/2017    Past Surgical History:  Procedure Laterality Date  . Epidural Steroid Injections    . KNEE SURGERY Left    arthroscopy  . SHOULDER ARTHROSCOPY WITH DEBRIDEMENT AND BICEP TENDON REPAIR    . SHOULDER ARTHROSCOPY WITH SUBACROMIAL DECOMPRESSION, ROTATOR CUFF REPAIR AND BICEP TENDON REPAIR Right 04/09/2015   Procedure: RIGHT SHOULDER ARTHROSCOPY WITH SUBACROMIAL DECOMPRESSION, DISTAL  CLAVICLE RESECTION LABRAL AND ROTATOR CUFF DEBRIDEMENT;  Surgeon: Justice Britain, MD;  Location: [facility];  Service: Orthopedics;  Laterality: Right;  . SHOULDER SURGERY Left    arthroscopy     Current Outpatient Medications  Medication Sig Dispense Refill  . celecoxib (CELEBREX) 200 MG capsule Take 200 mg by mouth daily.    . fluticasone (FLONASE) 50 MCG/ACT nasal spray Place 2 sprays into both nostrils as needed.     . Loratadine (CLARITIN PO) Take by mouth.    . nortriptyline (PAMELOR) 10 MG capsule Take 1 at bedtime for 1 weeks, then take 2 at bedtime for 1 week, then take 3 tablets at bedtime, stay at that dose 90 capsule 5  . ondansetron (  ZOFRAN) 4 MG tablet Take 1 tablet (4 mg total) by mouth every 8 (eight) hours as needed for nausea or vomiting. 20 tablet 3  . tiZANidine (ZANAFLEX) 4 MG tablet Take 1 tablet (4 mg total) by mouth every 6 (six) hours as needed for muscle spasms. 30 tablet 3   No current facility-administered medications for this visit.    Allergies:   Patient has no known allergies.    Social History:  The patient  reports that he quit smoking about 15 years ago. His smoking use included cigarettes. He has a 1.25 pack-year smoking history. He has quit using smokeless tobacco. He reports current alcohol use. He reports that he does not use drugs.   Family History:  The patient's family history includes  Cancer in his mother; Diabetes in his maternal grandmother; Heart attack in his maternal grandfather; Hypertension in his brother, father, mother, and sister; Ovarian cancer in his mother; Stomach cancer in his father; Stroke in his maternal grandmother; Thyroid disease in his mother.    ROS:  Please see the history of present illness.   Otherwise, review of systems are positive for migraines.   All other systems are reviewed and negative.    PHYSICAL EXAM: VS:  BP 122/74   Pulse 60   Ht 6\' 5"  (1.956 m)   Wt (!) 345 lb 12.8 oz (156.9 kg)   BMI 41.01 kg/m  , BMI Body mass index is 41.01 kg/m. GENERAL:  Well appearing HEENT: Pupils equal round and reactive, fundi not visualized, oral mucosa unremarkable NECK:  No jugular venous distention, waveform within normal limits, carotid upstroke brisk and symmetric, no bruits, no thyromegaly LYMPHATICS:  No cervical adenopathy LUNGS:  Clear to auscultation bilaterally HEART:  RRR.  PMI not displaced or sustained,S1 and S2 within normal limits, no S3, no S4, no clicks, no rubs, no murmurs ABD:  Flat, positive bowel sounds normal in frequency in pitch, no bruits, no rebound, no guarding, no midline pulsatile mass, no hepatomegaly, no splenomegaly EXT:  2 plus pulses throughout, no edema, no cyanosis no clubbing SKIN:  No rashes no nodules NEURO:  Cranial nerves II through XII grossly intact, motor grossly intact throughout PSYCH:  Cognitively intact, oriented to person place and time   EKG:  EKG is ordered today. 10/03/17: Sinus rhythm.  Rate 82 bpm. 03/08/2021: Sinus rhythm.  Rate 60 bpm.  First-degree AV block.   Recent Labs: No results found for requested labs within last 8760 hours.   06/13/17: WBC 6.0, hemoglobin 13.7, hematocrit 39.7, platelets 280 Sodium 140, potassium 4.1, BUN 18, creatinine 1.12 AST 42, ALT 24  02/22/17: Total cholesterol 161, triglycerides 77, HDL 52, LDL 94 TSH 0.81  Lipid Panel No results found for: CHOL, TRIG,  HDL, CHOLHDL, VLDL, LDLCALC, LDLDIRECT    Wt Readings from Last 3 Encounters:  03/08/21 (!) 345 lb 12.8 oz (156.9 kg)  02/10/21 (!) 343 lb (155.6 kg)  08/11/20 (!) 348 lb (157.9 kg)      ASSESSMENT AND PLAN:  # Palpitations: Resolved.  This was likely related to Graves' disease and has resolved with treatment.  # Atypical chest pain: He continues to have atypical symptoms that occur at times of stress or with stretching.  This is likely musculoskeletal and anxiety related.  May be that he is proceeded with therapy.  He is very active and has no exertional symptoms.  He also had normal coronary arteries on CT-A in 2019.  Therefore no recurrent ischemia evaluation at this  time.  # OSA: Continue CPAP.  # Obesity: [name] was congratulated on his diet and exercise.  He is interested in seeing the healthy weight wellness clinic.  We will refer him to the   Current medicines are reviewed at length with the patient today.  The patient does not have concerns regarding medicines.  The following changes have been made:  none  Labs/ tests ordered today include:   Orders Placed This Encounter  Procedures  . Ambulatory referral to [facility]  . EKG 12-Lead     Disposition:   FU with Corryn Madewell C. Oval Linsey, MD, [facility] as needed    Signed, [place] Oval Linsey, MD, [facility]  03/08/2021 10:33 AM    [facility]

## 2021-05-13 ENCOUNTER — Ambulatory Visit (INDEPENDENT_AMBULATORY_CARE_PROVIDER_SITE_OTHER): Payer: 59 | Admitting: Family Medicine

## 2021-05-13 ENCOUNTER — Ambulatory Visit: Payer: 59 | Admitting: Neurology

## 2021-05-13 ENCOUNTER — Encounter (INDEPENDENT_AMBULATORY_CARE_PROVIDER_SITE_OTHER): Payer: Self-pay | Admitting: Family Medicine

## 2021-05-13 ENCOUNTER — Encounter: Payer: Self-pay | Admitting: Neurology

## 2021-05-13 ENCOUNTER — Other Ambulatory Visit: Payer: Self-pay

## 2021-05-13 VITALS — BP 124/76 | HR 61 | Ht 77.0 in | Wt 334.0 lb

## 2021-05-13 VITALS — BP 123/78 | HR 62 | Temp 98.0°F | Ht 77.0 in | Wt 334.0 lb

## 2021-05-13 DIAGNOSIS — R5383 Other fatigue: Secondary | ICD-10-CM

## 2021-05-13 DIAGNOSIS — G4733 Obstructive sleep apnea (adult) (pediatric): Secondary | ICD-10-CM | POA: Diagnosis not present

## 2021-05-13 DIAGNOSIS — Z1331 Encounter for screening for depression: Secondary | ICD-10-CM | POA: Diagnosis not present

## 2021-05-13 DIAGNOSIS — R0602 Shortness of breath: Secondary | ICD-10-CM | POA: Diagnosis not present

## 2021-05-13 DIAGNOSIS — G4739 Other sleep apnea: Secondary | ICD-10-CM | POA: Diagnosis not present

## 2021-05-13 DIAGNOSIS — G43709 Chronic migraine without aura, not intractable, without status migrainosus: Secondary | ICD-10-CM

## 2021-05-13 DIAGNOSIS — R7303 Prediabetes: Secondary | ICD-10-CM

## 2021-05-13 DIAGNOSIS — E0500 Thyrotoxicosis with diffuse goiter without thyrotoxic crisis or storm: Secondary | ICD-10-CM | POA: Diagnosis not present

## 2021-05-13 DIAGNOSIS — Z6839 Body mass index (BMI) 39.0-39.9, adult: Secondary | ICD-10-CM

## 2021-05-13 DIAGNOSIS — Z0289 Encounter for other administrative examinations: Secondary | ICD-10-CM

## 2021-05-13 DIAGNOSIS — Z9189 Other specified personal risk factors, not elsewhere classified: Secondary | ICD-10-CM | POA: Diagnosis not present

## 2021-05-13 MED ORDER — ONDANSETRON 4 MG PO TBDP: 4.0000 mg | ORAL_TABLET | Freq: Three times a day (TID) | ORAL | 6 refills | Status: AC | PRN

## 2021-05-13 MED ORDER — NORTRIPTYLINE HCL 25 MG PO CAPS: 50.0000 mg | ORAL_CAPSULE | Freq: Every day | ORAL | 11 refills | Status: DC

## 2021-05-13 MED ORDER — RIZATRIPTAN BENZOATE 10 MG PO TBDP: 10.0000 mg | ORAL_TABLET | ORAL | 6 refills | Status: DC | PRN

## 2021-05-13 NOTE — Progress Notes (Signed)
Chief Complaint  Patient presents with   Follow-up    Room 12 alone. Migraine follow up. He is still having at least three migraines weekly. He has only been taking nortriptyline 10mg  at bedtime. He never increased the dose to 30mg  as planned. He is willing to try the higher dose now. Fioricet works well for pain relief (managed by his PCP).     ASSESSMENT AND PLAN  [name] is a 50 y.o. male    Chronic migraine Obstructive sleep apnea  Noncompliance with his CPAP machine due to claustrophobia  Increase nortriptyline to 25 mg 2 tablets every night as migraine prevention  Maxalt as needed for abortive treatment  DIAGNOSTIC DATA (LABS, IMAGING, TESTING) - I reviewed patient records, labs, notes, testing and imaging myself where available.  HISTORICAL  [name] is a 50 year old male, seen in request by his primary care physician Dr. Harrington Challenger, Dwyane Luo, for evaluation of migraine headaches, initial evaluation was August 12, 2019,  I reviewed and summarized the referring note. Chronic migraine OSA CPAP machine HTN Morbid obesity  He had a history of chronic migraine for many years, become more frequent in recent years, about 1-2 times every week, usually starting at the frontal region, pressure, move around to different spot, and headache he also has nausea, despite medications,  lasting about 4 to 5 hours, is usually associated with hungry, dehydration will trigger the headache  He has tried Excedrin Migraine with limited help,  This migraine started at age 60, he also complains of loud snoring, poor sleep quality, complains of depression, anxiety  UPDATE May 13 2021: He only takes low-dose nortriptyline 10 mg every night instead of titrating it up, he complains of frequent headaches, 1-3 times each week, use Fioricet as needed, getting prescription from Dr. Harrington Challenger,  He also complains of difficulty sleeping due to obesity, obstructive sleep apnea, had a  history of PTSD, claustrophobia, difficult for him to compliant with CPAP machine, is under the care of Dr. Elenore Rota,  PHYSICAL EXAM   Vitals:   05/13/21 1112  BP: 124/76  Pulse: 61  Weight: (!) 334 lb (151.5 kg)  Height: 6\' 5"  (1.956 m)    Body mass index is 39.61 kg/m.  PHYSICAL EXAMNIATION:  Gen: NAD, conversant, well nourised, well groomed  NEUROLOGICAL EXAM:  MENTAL STATUS: Speech/cognition: Awake, alert, oriented to history taking care of conversation   CRANIAL NERVES: CN II: Visual fields are full to confrontation. Pupils are round equal and briskly reactive to light. CN III, IV, VI: extraocular movement are normal. No ptosis. CN V: Facial sensation is intact to light touch CN VII: Face is symmetric with normal eye closure  CN VIII: Hearing is normal to causal conversation. CN IX, X: Phonation is normal.  Narrow oropharyngeal CN XI: Head turning and shoulder shrug are intact   MOTOR: Good strength all extremities  REFLEXES: 2+ reflexes throughout  SENSORY: Intact to soft touch  COORDINATION: Finger-nose-finger and heel-to-shin is normal  GAIT/STANCE: Gait is normal   REVIEW OF SYSTEMS: Full 14 system review of systems performed and notable only for as above  Headache  ALLERGIES: No Known Allergies  HOME MEDICATIONS: Current Outpatient Medications  Medication Sig Dispense Refill   Butalbital-APAP-Caffeine 50-325-40 MG capsule Take 1 capsule by mouth as needed.     fluticasone (FLONASE) 50 MCG/ACT nasal spray Place 2 sprays into both nostrils as needed.      Loratadine (CLARITIN PO) Take 10 mg by mouth daily.  Multiple Vitamins-Minerals (CENTRUM SILVER 50+MEN) TABS Take 1 tablet by mouth daily.     nortriptyline (PAMELOR) 10 MG capsule Take 10 mg by mouth at bedtime.     zolpidem (AMBIEN) 5 MG tablet Take 5 mg by mouth at bedtime as needed for sleep.     No current facility-administered medications for this visit.    PAST MEDICAL  HISTORY: Past Medical History:  Diagnosis Date   Arthritis    Atypical chest pain 10/24/2017   Atypical chest pain 10/24/2017   Back pain    Depression    GERD (gastroesophageal reflux disease)    occ   Graves disease 12/29/2017   Joint pain    Left knee pain    Migraine    Morbid obesity ([place]) 10/24/2017   Neck pain    Night sweats 10/24/2017   Numbness and tingling in left arm    Numbness and tingling in left hand    OSA (obstructive sleep apnea) 10/24/2017   Other fatigue    Polyuria 10/24/2017   PTSD (post-traumatic stress disorder)    Seasonal allergies    Sleep apnea    cpap 1 yr   Unintentional weight loss 10/24/2017    PAST SURGICAL HISTORY: Past Surgical History:  Procedure Laterality Date   Epidural Steroid Injections     KNEE SURGERY Left    arthroscopy (Pt reports 3 left knee procedures) last one done on 09/16/2020)   SHOULDER ARTHROSCOPY WITH DEBRIDEMENT AND BICEP TENDON REPAIR     SHOULDER ARTHROSCOPY WITH SUBACROMIAL DECOMPRESSION, ROTATOR CUFF REPAIR AND BICEP TENDON REPAIR Right 04/09/2015   Procedure: RIGHT SHOULDER ARTHROSCOPY WITH SUBACROMIAL DECOMPRESSION, DISTAL  CLAVICLE RESECTION LABRAL AND ROTATOR CUFF DEBRIDEMENT;  Surgeon: Justice Britain, MD;  Location: [facility];  Service: Orthopedics;  Laterality: Right;   SHOULDER SURGERY Left    arthroscopy    FAMILY HISTORY: Family History  Problem Relation Age of Onset   Obesity Mother    Hypertension Mother    Ovarian cancer Mother    Cancer Mother        ovarian   Thyroid disease Mother    Drug abuse Father    Alcoholism Father    Cancer Father    Hypertension Father    Stomach cancer Father    Hypertension Sister    Hypertension Brother    Stroke Maternal Grandmother    Diabetes Maternal Grandmother    Heart attack Maternal Grandfather    Alcoholism Other    Drug abuse Other    Obesity Other     SOCIAL HISTORY: Social History   Socioeconomic History   Marital status: Married     Spouse name: Angie   Number of children: 2   Years of education: College    Highest education level: Not on file  Occupational History   Occupation: General Floor Help - [employer]   Occupation: [employer] engineer  Tobacco Use   Smoking status: Former    Packs/day: 0.25    Years: 5.00    Pack years: 1.25    Types: Cigarettes    Quit date: 03/29/2005    Years since quitting: 16.1   Smokeless tobacco: Former  Scientific laboratory technician Use: Never used  Substance and Sexual Activity   Alcohol use: Yes    Alcohol/week: 0.0 standard drinks    Comment: Social use - rare   Drug use: No   Sexual activity: Yes    Partners: Female  Other Topics Concern   Not on file  Social History Narrative   Lives with wife and two children.   Right-handed.   Occasional use of caffeine- soda   Social Determinants of Health   Financial Resource Strain: Not on file  Food Insecurity: Not on file  Transportation Needs: Not on file  Physical Activity: Not on file  Stress: Not on file  Social Connections: Not on file  Intimate Partner Violence: Not on file

## 2021-05-13 NOTE — Patient Instructions (Signed)
Meds ordered this encounter  Medications   nortriptyline (PAMELOR) 25 MG capsule    Sig: Take 2 capsules (50 mg total) by mouth at bedtime.--every night to prevent migrain    Dispense:  60 capsule    Refill:  11   rizatriptan (MAXALT-MLT) 10 MG disintegrating tablet    Sig: Take 1 tablet (10 mg total) by mouth as needed. May repeat in 2 hours if needed--at the beginning of headache    Dispense:  15 tablet    Refill:  6   ondansetron (ZOFRAN ODT) 4 MG disintegrating tablet--as needed for nause    Sig: Take 1 tablet (4 mg total) by mouth every 8 (eight) hours as needed for nausea or vomiting.    Dispense:  20 tablet    Refill:  6      Take maxalt at the beginning of headaches, may combine with zofran for nause, aleve 1-2 tabs for headache and sleep.

## 2021-05-14 LAB — COMPREHENSIVE METABOLIC PANEL
ALT: 15 IU/L (ref 0–44)
AST: 21 IU/L (ref 0–40)
Albumin/Globulin Ratio: 2 (ref 1.2–2.2)
Albumin: 4.7 g/dL (ref 3.8–4.9)
Alkaline Phosphatase: 59 IU/L (ref 44–121)
BUN/Creatinine Ratio: 16 (ref 9–20)
BUN: 15 mg/dL (ref 6–24)
Bilirubin Total: 0.3 mg/dL (ref 0.0–1.2)
CO2: 23 mmol/L (ref 20–29)
Calcium: 9.7 mg/dL (ref 8.7–10.2)
Chloride: 102 mmol/L (ref 96–106)
Creatinine, Ser: 0.92 mg/dL (ref 0.76–1.27)
Globulin, Total: 2.4 g/dL (ref 1.5–4.5)
Glucose: 82 mg/dL (ref 65–99)
Potassium: 4.8 mmol/L (ref 3.5–5.2)
Sodium: 140 mmol/L (ref 134–144)
Total Protein: 7.1 g/dL (ref 6.0–8.5)
eGFR: 101 mL/min/{1.73_m2} (ref >59)

## 2021-05-14 LAB — CBC WITH DIFFERENTIAL/PLATELET
Basophils Absolute: 0 10*3/uL (ref 0.0–0.2)
Basos: 1 %
EOS (ABSOLUTE): 0.1 10*3/uL (ref 0.0–0.4)
Eos: 2 %
Hematocrit: 44.4 % (ref 37.5–51.0)
Hemoglobin: 14.8 g/dL (ref 13.0–17.7)
Immature Grans (Abs): 0 10*3/uL (ref 0.0–0.1)
Immature Granulocytes: 0 %
Lymphocytes Absolute: 2 10*3/uL (ref 0.7–3.1)
Lymphs: 39 %
MCH: 29.1 pg (ref 26.6–33.0)
MCHC: 33.3 g/dL (ref 31.5–35.7)
MCV: 87 fL (ref 79–97)
Monocytes Absolute: 0.3 10*3/uL (ref 0.1–0.9)
Monocytes: 7 %
Neutrophils Absolute: 2.6 10*3/uL (ref 1.4–7.0)
Neutrophils: 51 %
Platelets: 272 10*3/uL (ref 150–450)
RBC: 5.08 x10E6/uL (ref 4.14–5.80)
RDW: 13.2 % (ref 11.6–15.4)
WBC: 5.1 10*3/uL (ref 3.4–10.8)

## 2021-05-14 LAB — LIPID PANEL WITH LDL/HDL RATIO
Cholesterol, Total: 195 mg/dL (ref 100–199)
HDL: 60 mg/dL (ref >39)
LDL Chol Calc (NIH): 123 mg/dL — ABNORMAL HIGH (ref 0–99)
LDL/HDL Ratio: 2.1 ratio (ref 0.0–3.6)
Triglycerides: 67 mg/dL (ref 0–149)
VLDL Cholesterol Cal: 12 mg/dL (ref 5–40)

## 2021-05-14 LAB — INSULIN, RANDOM: INSULIN: 15.6 u[IU]/mL (ref 2.6–24.9)

## 2021-05-14 LAB — FOLATE: Folate: 8.6 ng/mL (ref >3.0)

## 2021-05-14 LAB — HEMOGLOBIN A1C
Est. average glucose Bld gHb Est-mCnc: 123 mg/dL
Hgb A1c MFr Bld: 5.9 % — ABNORMAL HIGH (ref 4.8–5.6)

## 2021-05-14 LAB — VITAMIN D 25 HYDROXY (VIT D DEFICIENCY, FRACTURES): Vit D, 25-Hydroxy: 28.1 ng/mL — ABNORMAL LOW (ref 30.0–100.0)

## 2021-05-14 LAB — VITAMIN B12: Vitamin B-12: 393 pg/mL (ref 232–1245)

## 2021-05-19 NOTE — Progress Notes (Signed)
Chief Complaint:   OBESITY [name] (MR# [id number]) is a 50 y.o. male who presents for evaluation and treatment of obesity and related comorbidities. Current BMI is Body mass index is 39.61 kg/m. [name] has been struggling with his weight for many years and has been unsuccessful in either losing weight, maintaining weight loss, or reaching his healthy weight goal.  [name] is currently in the action stage of change and ready to dedicate time achieving and maintaining a healthier weight. [name] is interested in becoming our patient and working on intensive lifestyle modifications including (but not limited to) diet and exercise for weight loss.  Rode by clinic. Donta previously played semi-pro ball. Breakfast is low carb platter from [place] (satisfied); Snack- cottage cheese with fruit or peanuts (hungry); Lunch- leftovers or [employer] buffalo chicken wrap on tomato basil with pepperoni ranch + provolone cheese, +/- Sun chips (feel full); Dinner- 1/2 cup coleslaw, 1/2 cup baked beans, 4 pieces fish (satisfied); No after dinner snacks.  [name]'s habits were reviewed today and are as follows: His family eats meals together, he thinks his family will eat healthier with him, his desired weight loss is 84 lbs, he has been heavy most of his life, his heaviest weight ever was 357 pounds, he has significant food cravings issues, he snacks frequently in the evenings, he skips meals frequently, he is frequently drinking liquids with calories, he has problems with excessive hunger, he frequently eats larger portions than normal, and he struggles with emotional eating.  Depression Screen [name]'s Food and Mood (modified PHQ-9) score was 8.  Depression screen PHQ 2/9 05/13/2021  Decreased Interest 1  Down, Depressed, Hopeless 0  PHQ - 2 Score 1  Altered sleeping 1  Tired, decreased energy 3  Change in appetite 2  Feeling bad or failure about yourself  0   Trouble concentrating 1  Moving slowly or fidgety/restless 0  Suicidal thoughts 0  PHQ-9 Score 8  Difficult doing work/chores Not difficult at all   Subjective:   1. Other fatigue [name] admits to daytime somnolence and admits to waking up still tired. Patent has a history of symptoms of daytime fatigue, morning fatigue, and morning headache. [name] generally gets  3-5  hours of sleep per night, and states that he has poor sleep quality. Snoring is present. Apneic episodes are present. Epworth Sleepiness Score is 10. EKG done 03/08/2021- NSR with 1st degree block.  2. SOB (shortness of breath) Harrell Gave notes increasing shortness of breath with exercising and seems to be worsening over time with weight gain. He notes getting out of breath sooner with activity than he used to. This has not gotten worse recently. Mel denies shortness of breath at rest or orthopnea. EKG done 03/08/2021- NSR with 1st degree block.  3. Graves disease Legion's last labs were WNL. He sees Dr. Buddy Duty at endocrinology. Previously on methimazole.  4. Pre-diabetes Last A1c in Epic was 5.8. Pt is not on meds.  5. OSA (obstructive sleep apnea) [name] has a CPAP but struggles with wearing it. He didn't notice much improvement when he did use it. He sees sleep doc, Dr. Maxwell Caul.  6. At risk for diabetes mellitus [name] is at higher than average risk for developing diabetes due to obesity.   Assessment/Plan:   1. Other fatigue [name] does feel that his weight is causing his energy to be lower than it should be. Fatigue may be related to obesity, depression or many other causes. Labs will be ordered,  and in the meanwhile, [name] will focus on self care including making healthy food choices, increasing physical activity and focusing on stress reduction. Check labs today.  - Vitamin B12 - Folate - VITAMIN D 25 Hydroxy (Vit-D Deficiency, Fractures)  2. SOB (shortness of  breath) [name] does not feel that he gets out of breath more easily that he used to when he exercises. [name]'s shortness of breath appears to be obesity related and exercise induced. He has agreed to work on weight loss and gradually increase exercise to treat his exercise induced shortness of breath. Will continue to monitor closely.  3. Graves disease Follow up with Dr. Buddy Duty.  4. Pre-diabetes [name] will continue to work on weight loss, exercise, and decreasing simple carbohydrates to help decrease the risk of diabetes.  Check labs today.  - Comprehensive metabolic panel - Hemoglobin A1c - Insulin, random - Lipid Panel With LDL/HDL Ratio  5. OSA (obstructive sleep apnea) Intensive lifestyle modifications are the first line treatment for this issue. We discussed several lifestyle modifications today and he will continue to work on diet, exercise and weight loss efforts. We will continue to monitor. Orders and follow up as documented in patient record.  Follow up with sleep doctor. Check labs today.  - CBC with Differential/Platelet  6. Screening for depression [name] had a positive depression screening. Depression is commonly associated with obesity and often results in emotional eating behaviors. We will monitor this closely and work on CBT to help improve the non-hunger eating patterns. Referral to Psychology may be required if no improvement is seen as he continues in our clinic.  7. At risk for diabetes mellitus [name] was given approximately 15 minutes of diabetes education and counseling today. We discussed intensive lifestyle modifications today with an emphasis on weight loss as well as increasing exercise and decreasing simple carbohydrates in his diet. We also reviewed medication options with an emphasis on risk versus benefit of those discussed.   Repetitive spaced learning was employed today to elicit superior memory formation and behavioral  change.  8. Class 2 severe obesity with serious comorbidity and body mass index (BMI) of 39.0 to 39.9 in adult, unspecified obesity type (HCC)  [name] is currently in the action stage of change and his goal is to continue with weight loss efforts. I recommend [name] begin the structured treatment plan as follows:  He has agreed to the Category 4 Plan.  Exercise goals:  As is    Behavioral modification strategies: increasing lean protein intake, meal planning and cooking strategies, keeping healthy foods in the home, and planning for success.  He was informed of the importance of frequent follow-up visits to maximize his success with intensive lifestyle modifications for his multiple health conditions. He was informed we would discuss his lab results at his next visit unless there is a critical issue that needs to be addressed sooner. [name] agreed to keep his next visit at the agreed upon time to discuss these results.  Objective:   Blood pressure 123/78, pulse 62, temperature 98 F (36.7 C), height 6\' 5"  (1.956 m), weight (!) 334 lb (151.5 kg), SpO2 97 %. Body mass index is 39.61 kg/m.  EKG: Normal sinus rhythm, rate 60 with 1st degree block. ([facility] on 03/08/21)  Indirect Calorimeter completed today shows a VO2 of 341 and a REE of 2347.  His calculated basal metabolic rate is 2947 thus his basal metabolic rate is worse than expected.  General: Cooperative, alert, well developed, in no  acute distress. HEENT: Conjunctivae and lids unremarkable. Cardiovascular: Regular rhythm.  Lungs: Normal work of breathing. Neurologic: No focal deficits.   Lab Results  Component Value Date   CREATININE 0.92 05/13/2021   BUN 15 05/13/2021   NA 140 05/13/2021   K 4.8 05/13/2021   CL 102 05/13/2021   CO2 23 05/13/2021   Lab Results  Component Value Date   ALT 15 05/13/2021   AST 21 05/13/2021   ALKPHOS 59 05/13/2021   BILITOT 0.3 05/13/2021   Lab Results  Component  Value Date   HGBA1C 5.9 (H) 05/13/2021   HGBA1C 5.8 (H) 10/24/2017   Lab Results  Component Value Date   INSULIN 15.6 05/13/2021   Lab Results  Component Value Date   TSH <0.006 (L) 10/24/2017   Lab Results  Component Value Date   CHOL 195 05/13/2021   HDL 60 05/13/2021   LDLCALC 123 (H) 05/13/2021   TRIG 67 05/13/2021   Lab Results  Component Value Date   WBC 5.1 05/13/2021   HGB 14.8 05/13/2021   HCT 44.4 05/13/2021   MCV 87 05/13/2021   PLT 272 05/13/2021    Attestation Statements:   Reviewed by clinician on day of visit: allergies, medications, problem list, medical history, surgical history, family history, social history, and previous encounter notes.  Coral Ceo, CMA, am acting as transcriptionist for Coralie Common, MD.   This is the patient's first visit at [facility]. The patient's NEW PATIENT PACKET was reviewed at length. Included in the packet: current and past health history, medications, allergies, ROS, gynecologic history (women only), surgical history, family history, social history, weight history, weight loss surgery history (for those that have had weight loss surgery), nutritional evaluation, mood and food questionnaire, PHQ9, Epworth questionnaire, sleep habits questionnaire, patient life and health improvement goals questionnaire. These will all be scanned into the patient's chart under media.   During the visit, I independently reviewed the patient's EKG, bioimpedance scale results, and indirect calorimeter results. I used this information to tailor a meal plan for the patient that will help him to lose weight and will improve his obesity-related conditions going forward. I performed a medically necessary appropriate examination and/or evaluation. I discussed the assessment and treatment plan with the patient. The patient was provided an opportunity to ask questions and all were answered. The patient agreed with the plan and  demonstrated an understanding of the instructions. Labs were ordered at this visit and will be reviewed at the next visit unless more critical results need to be addressed immediately. Clinical information was updated and documented in the EMR.   Time spent on visit including pre-visit chart review and post-visit care was 45 minutes.   A separate 15 minutes was spent on risk counseling (see above).  I have reviewed the above documentation for accuracy and completeness, and I agree with the above. - Coralie Common, MD

## 2021-05-27 ENCOUNTER — Encounter (INDEPENDENT_AMBULATORY_CARE_PROVIDER_SITE_OTHER): Payer: Self-pay | Admitting: Family Medicine

## 2021-05-27 ENCOUNTER — Ambulatory Visit (INDEPENDENT_AMBULATORY_CARE_PROVIDER_SITE_OTHER): Payer: 59 | Admitting: Family Medicine

## 2021-05-27 ENCOUNTER — Other Ambulatory Visit: Payer: Self-pay

## 2021-05-27 VITALS — BP 107/68 | HR 60 | Temp 97.9°F | Ht 77.0 in | Wt 332.0 lb

## 2021-05-27 DIAGNOSIS — E559 Vitamin D deficiency, unspecified: Secondary | ICD-10-CM | POA: Diagnosis not present

## 2021-05-27 DIAGNOSIS — E7800 Pure hypercholesterolemia, unspecified: Secondary | ICD-10-CM | POA: Diagnosis not present

## 2021-05-27 DIAGNOSIS — R7303 Prediabetes: Secondary | ICD-10-CM | POA: Diagnosis not present

## 2021-05-27 DIAGNOSIS — Z9189 Other specified personal risk factors, not elsewhere classified: Secondary | ICD-10-CM | POA: Diagnosis not present

## 2021-05-27 DIAGNOSIS — Z6839 Body mass index (BMI) 39.0-39.9, adult: Secondary | ICD-10-CM

## 2021-05-27 MED ORDER — VITAMIN D (ERGOCALCIFEROL) 1.25 MG (50000 UNIT) PO CAPS: 50000.0000 [IU] | ORAL_CAPSULE | ORAL | 0 refills | Status: DC

## 2021-06-01 NOTE — Progress Notes (Signed)
Chief Complaint:   OBESITY Kinnith is here to discuss his progress with his obesity treatment plan along with follow-up of his obesity related diagnoses. Decarlos is on the Category 4 Plan and states he is following his eating plan approximately ?% of the time. Welden states he is going to [employer] 50 minutes 2 times per week.  Today's visit was #: 2 Starting weight: 334 lbs Starting date: 05/13/2021 Today's weight: 332 lbs Today's date: 05/27/2021 Total lbs lost to date: 2 Total lbs lost since last in-office visit: 2  Interim History: Venton is concerned about sodium content of microwave meals. He got oatmeal, orange juice, and is trying to use these to lower cholesterol. Pt is fasting this week so he is not eating meat (fasting for religous reasons). He has started doing labrada lean protein powder to get protein in during the day. Lunch was often microwave meals. Dinner is regular portion of meat.  Subjective:   1. Pure hypercholesterolemia Johnta's LDL is 123, HDL 60, triglycerides 67, and total cholesterol 195. He is not on a statin. 10 year ASCVD risk 4.1% optimal (current 3.8%).    Lab Results  Component Value Date   CHOL 195 05/13/2021   HDL 60 05/13/2021   LDLCALC 123 (H) 05/13/2021   TRIG 67 05/13/2021   Lab Results  Component Value Date   ALT 15 05/13/2021   AST 21 05/13/2021   ALKPHOS 59 05/13/2021   BILITOT 0.3 05/13/2021   The 10-year ASCVD risk score Mikey Bussing DC Jr., et al., 2013) is: 3.8%   Values used to calculate the score:     Age: 50 years     Sex: Male     Is Non-Hispanic African American: Yes     Diabetic: No     Tobacco smoker: No     Systolic Blood Pressure: [id number] mmHg     Is BP treated: No     HDL Cholesterol: 60 mg/dL     Total Cholesterol: 195 mg/dL  2. Vitamin D deficiency Pt denies nausea, vomiting, and muscle weakness but notes fatigue. He is not on a Vit D supplement.  3. Pre-diabetes His last A1c was 5.9  and insulin level 15.6. He is not on medication. Pt is noticing an increase in hunger recently with change in breakfast to oatmeal.  4. At risk for diabetes mellitus Mel is at higher than average risk for developing diabetes due to obesity.   Assessment/Plan:   1. Pure hypercholesterolemia Cardiovascular risk and specific lipid/LDL goals reviewed.  We discussed several lifestyle modifications today and Fontaine will continue to work on diet, exercise and weight loss efforts. Orders and follow up as documented in patient record.   Counseling Intensive lifestyle modifications are the first line treatment for this issue. Dietary changes: Increase soluble fiber. Decrease simple carbohydrates. Exercise changes: Moderate to vigorous-intensity aerobic activity 150 minutes per week if tolerated. Lipid-lowering medications: see documented in medical record.  2. Vitamin D deficiency Low Vitamin D level contributes to fatigue and are associated with obesity, breast, and colon cancer. He agrees to start to take prescription Vitamin D '@50'$ ,000 IU every week and will follow-up for routine testing of Vitamin D, at least 2-3 times per year to avoid over-replacement.  Start- Vitamin D, Ergocalciferol, (DRISDOL) 1.25 MG (50000 UNIT) CAPS capsule; Take 1 capsule (50,000 Units total) by mouth every 7 (seven) days.  Dispense: 4 capsule; Refill: 0  3. Pre-diabetes Tane will continue to work on weight loss,  exercise, and decreasing simple carbohydrates to help decrease the risk of diabetes. Follow up in 3 months. Continue category 4 meal plan.  4. At risk for diabetes mellitus Caziah was given approximately 15 minutes of diabetes education and counseling today. We discussed intensive lifestyle modifications today with an emphasis on weight loss as well as increasing exercise and decreasing simple carbohydrates in his diet. We also reviewed medication options with an emphasis on risk versus  benefit of those discussed.   Repetitive spaced learning was employed today to elicit superior memory formation and behavioral change.  5. Class 2 severe obesity with serious comorbidity and body mass index (BMI) of 39.0 to 39.9 in adult, unspecified obesity type (HCC)  Jamarkus is currently in the action stage of change. As such, his goal is to continue with weight loss efforts. He has agreed to the Category 4 Plan.   Exercise goals:  As is  Behavioral modification strategies: increasing lean protein intake and meal planning and cooking strategies.  Cleason has agreed to follow-up with our clinic in 2-3 weeks. He was informed of the importance of frequent follow-up visits to maximize his success with intensive lifestyle modifications for his multiple health conditions.   Objective:   Blood pressure 107/68, pulse 60, temperature 97.9 F (36.6 C), height '6\' 5"'$  (1.956 m), weight (!) 332 lb (150.6 kg), SpO2 97 %. Body mass index is 39.37 kg/m.  General: Cooperative, alert, well developed, in no acute distress. HEENT: Conjunctivae and lids unremarkable. Cardiovascular: Regular rhythm.  Lungs: Normal work of breathing. Neurologic: No focal deficits.   Lab Results  Component Value Date   CREATININE 0.92 05/13/2021   BUN 15 05/13/2021   NA 140 05/13/2021   K 4.8 05/13/2021   CL 102 05/13/2021   CO2 23 05/13/2021   Lab Results  Component Value Date   ALT 15 05/13/2021   AST 21 05/13/2021   ALKPHOS 59 05/13/2021   BILITOT 0.3 05/13/2021   Lab Results  Component Value Date   HGBA1C 5.9 (H) 05/13/2021   HGBA1C 5.8 (H) 10/24/2017   Lab Results  Component Value Date   INSULIN 15.6 05/13/2021   Lab Results  Component Value Date   TSH <0.006 (L) 10/24/2017   Lab Results  Component Value Date   CHOL 195 05/13/2021   HDL 60 05/13/2021   LDLCALC 123 (H) 05/13/2021   TRIG 67 05/13/2021   Lab Results  Component Value Date   VD25OH 28.1 (L) 05/13/2021   Lab  Results  Component Value Date   WBC 5.1 05/13/2021   HGB 14.8 05/13/2021   HCT 44.4 05/13/2021   MCV 87 05/13/2021   PLT 272 05/13/2021   No results found for: IRON, TIBC, FERRITIN   Attestation Statements:   Reviewed by clinician on day of visit: allergies, medications, problem list, medical history, surgical history, family history, social history, and previous encounter notes.  Coral Ceo, CMA, am acting as transcriptionist for Coralie Common, MD.   I have reviewed the above documentation for accuracy and completeness, and I agree with the above. - Coralie Common, MD

## 2021-06-17 ENCOUNTER — Ambulatory Visit (INDEPENDENT_AMBULATORY_CARE_PROVIDER_SITE_OTHER): Payer: 59 | Admitting: Family Medicine

## 2021-06-29 ENCOUNTER — Other Ambulatory Visit (INDEPENDENT_AMBULATORY_CARE_PROVIDER_SITE_OTHER): Payer: Self-pay | Admitting: Family Medicine

## 2021-06-29 DIAGNOSIS — E559 Vitamin D deficiency, unspecified: Secondary | ICD-10-CM

## 2021-07-15 ENCOUNTER — Ambulatory Visit (INDEPENDENT_AMBULATORY_CARE_PROVIDER_SITE_OTHER): Payer: 59 | Admitting: Family Medicine

## 2021-07-21 NOTE — Progress Notes (Signed)
Chart reviewed, agree above plan ?

## 2021-07-22 ENCOUNTER — Encounter (INDEPENDENT_AMBULATORY_CARE_PROVIDER_SITE_OTHER): Payer: Self-pay | Admitting: Family Medicine

## 2021-07-22 ENCOUNTER — Ambulatory Visit (INDEPENDENT_AMBULATORY_CARE_PROVIDER_SITE_OTHER): Payer: 59 | Admitting: Family Medicine

## 2021-07-22 ENCOUNTER — Other Ambulatory Visit: Payer: Self-pay

## 2021-07-22 VITALS — BP 115/68 | HR 83 | Temp 98.0°F | Ht 77.0 in | Wt 333.0 lb

## 2021-07-22 DIAGNOSIS — R7303 Prediabetes: Secondary | ICD-10-CM

## 2021-07-22 DIAGNOSIS — Z9189 Other specified personal risk factors, not elsewhere classified: Secondary | ICD-10-CM | POA: Diagnosis not present

## 2021-07-22 DIAGNOSIS — E559 Vitamin D deficiency, unspecified: Secondary | ICD-10-CM

## 2021-07-22 DIAGNOSIS — Z6839 Body mass index (BMI) 39.0-39.9, adult: Secondary | ICD-10-CM

## 2021-07-22 MED ORDER — VITAMIN D (ERGOCALCIFEROL) 1.25 MG (50000 UNIT) PO CAPS: 50000.0000 [IU] | ORAL_CAPSULE | ORAL | 0 refills | Status: DC

## 2021-07-22 NOTE — Progress Notes (Signed)
Chief Complaint:   OBESITY [name] is here to discuss his progress with his obesity treatment plan along with follow-up of his obesity related diagnoses. [name] is on the Category 4 Plan and states he is following his eating plan approximately 50% of the time. [name] states he is at gym for 60-120 minutes 3 times per week.  Today's visit was #: 3 Starting weight: 334 lbs Starting date: 05/13/2021 Today's weight: 333 lbs Today's date: 07/22/2021 Total lbs lost to date: 1 Total lbs lost since last in-office visit: 0  Interim History: [name] is trying to eat healthier overall. He is using protein supplementations to help him meet his protein goals.  Subjective:   1. Vitamin D deficiency [name] has been on Vit D for 1 month, but he hasn't taken it since. His level is still not yet at goal.  2. Pre-diabetes [name] continues to work on decreasing simple carbohydrates.  3. At risk for diabetes mellitus [name] is at higher than average risk for developing diabetes due to obesity.   Assessment/Plan:   1. Vitamin D deficiency Low Vitamin D level contributes to fatigue and are associated with obesity, breast, and colon cancer. We will refill prescription Vitamin D for 1 month. [name] will follow-up for routine testing of Vitamin D, at least 2-3 times per year to avoid over-replacement.  - Vitamin D, Ergocalciferol, (DRISDOL) 1.25 MG (50000 UNIT) CAPS capsule; Take 1 capsule (50,000 Units total) by mouth every 7 (seven) days.  Dispense: 4 capsule; Refill: 0  2. Pre-diabetes [name] will continue to work on weight loss, exercise, and decreasing simple carbohydrates to help decrease the risk of diabetes.   3. At risk for diabetes mellitus [name] was given approximately 30 minutes of diabetes education and counseling today. We discussed intensive lifestyle modifications today with an emphasis on weight loss as well as increasing exercise and  decreasing simple carbohydrates in his diet. We also reviewed medication options with an emphasis on risk versus benefit of those discussed.   Repetitive spaced learning was employed today to elicit superior memory formation and behavioral change.  4. Obesity with current BMI of 39.6 [name] is currently in the action stage of change. As such, his goal is to continue with weight loss efforts. He has agreed to keeping a food journal and adhering to recommended goals of 2000 calories and 130+ grams of protein daily.   Exercise goals: As is.  Behavioral modification strategies: keeping a strict food journal.  Reo has agreed to follow-up with our clinic in 2 to 3 weeks. He was informed of the importance of frequent follow-up visits to maximize his success with intensive lifestyle modifications for his multiple health conditions.   Objective:   Blood pressure 115/68, pulse 83, temperature 98 F (36.7 C), height '6\' 5"'$  (1.956 m), weight (!) 333 lb (151 kg), SpO2 96 %. Body mass index is 39.49 kg/m.  General: Cooperative, alert, well developed, in no acute distress. HEENT: Conjunctivae and lids unremarkable. Cardiovascular: Regular rhythm.  Lungs: Normal work of breathing. Neurologic: No focal deficits.   Lab Results  Component Value Date   CREATININE 0.92 05/13/2021   BUN 15 05/13/2021   NA 140 05/13/2021   K 4.8 05/13/2021   CL 102 05/13/2021   CO2 23 05/13/2021   Lab Results  Component Value Date   ALT 15 05/13/2021   AST 21 05/13/2021   ALKPHOS 59 05/13/2021   BILITOT 0.3 05/13/2021   Lab Results  Component Value Date  HGBA1C 5.9 (H) 05/13/2021   HGBA1C 5.8 (H) 10/24/2017   Lab Results  Component Value Date   INSULIN 15.6 05/13/2021   Lab Results  Component Value Date   TSH <0.006 (L) 10/24/2017   Lab Results  Component Value Date   CHOL 195 05/13/2021   HDL 60 05/13/2021   LDLCALC 123 (H) 05/13/2021   TRIG 67 05/13/2021   Lab Results  Component  Value Date   VD25OH 28.1 (L) 05/13/2021   Lab Results  Component Value Date   WBC 5.1 05/13/2021   HGB 14.8 05/13/2021   HCT 44.4 05/13/2021   MCV 87 05/13/2021   PLT 272 05/13/2021   No results found for: IRON, TIBC, FERRITIN  Attestation Statements:   Reviewed by clinician on day of visit: allergies, medications, problem list, medical history, surgical history, family history, social history, and previous encounter notes.   I, Trixie Dredge, am acting as transcriptionist for Dennard Nip, MD.  I have reviewed the above documentation for accuracy and completeness, and I agree with the above. -  Dennard Nip, MD

## 2021-08-12 ENCOUNTER — Ambulatory Visit (INDEPENDENT_AMBULATORY_CARE_PROVIDER_SITE_OTHER): Payer: 59 | Admitting: Family Medicine

## 2021-08-12 ENCOUNTER — Encounter (INDEPENDENT_AMBULATORY_CARE_PROVIDER_SITE_OTHER): Payer: Self-pay | Admitting: Family Medicine

## 2021-08-12 ENCOUNTER — Other Ambulatory Visit: Payer: Self-pay

## 2021-08-12 VITALS — BP 116/69 | HR 65 | Temp 98.2°F | Ht 77.0 in | Wt 329.0 lb

## 2021-08-12 DIAGNOSIS — E559 Vitamin D deficiency, unspecified: Secondary | ICD-10-CM | POA: Diagnosis not present

## 2021-08-12 DIAGNOSIS — Z6839 Body mass index (BMI) 39.0-39.9, adult: Secondary | ICD-10-CM | POA: Diagnosis not present

## 2021-08-12 DIAGNOSIS — R7303 Prediabetes: Secondary | ICD-10-CM

## 2021-08-12 DIAGNOSIS — Z9189 Other specified personal risk factors, not elsewhere classified: Secondary | ICD-10-CM

## 2021-08-16 NOTE — Progress Notes (Signed)
Chief Complaint:   OBESITY [name] is here to discuss his progress with his obesity treatment plan along with follow-up of his obesity related diagnoses. [name] is on keeping a food journal and adhering to recommended goals of 2000 calories and 130 grams of protein daily and states he is following his eating plan approximately 85% of the time. [name] states he is at the gym doing weights and on the elliptical for 90-120 minutes 3-4 times per week.  Today's visit was #: 4 Starting weight: 334 lbs Starting date: 05/13/2021 Today's weight: 329 lbs Today's date: 08/12/2021 Total lbs lost to date: 5 Total lbs lost since last in-office visit: 4  Interim History: [name] continues to do well with weight loss. He is journaling and working on meeting his calorie and protein goals.  Subjective:   1. Pre-diabetes [name] continues to do well with diet and weight loss. He is stable and he notes decreased polyphagia.  2. Vitamin D deficiency [name] is stable on Vit D. His level is not yet at goal.  3. At risk for diabetes mellitus [name] is at higher than average risk for developing diabetes due to obesity.   Assessment/Plan:   1. Pre-diabetes [name] will continue with diet, exercise, and decreasing simple carbohydrates to help decrease the risk of diabetes. We will recheck labs in 2 months.  2. Vitamin D deficiency Low Vitamin D level contributes to fatigue and are associated with obesity, breast, and colon cancer. We will refill prescription Vitamin D we will recheck labs in 2 months. [name] will follow-up for routine testing of Vitamin D, at least 2-3 times per year to avoid over-replacement.  3. At risk for diabetes mellitus [name] was given approximately 15 minutes of diabetes education and counseling today. We discussed intensive lifestyle modifications today with an emphasis on weight loss as well as increasing exercise and decreasing  simple carbohydrates in his diet. We also reviewed medication options with an emphasis on risk versus benefit of those discussed.   Repetitive spaced learning was employed today to elicit superior memory formation and behavioral change.  4. Obesity with current BMI of 39.1 [name] is currently in the action stage of change. As such, his goal is to continue with weight loss efforts. He has agreed to keeping a food journal and adhering to recommended goals of 2000 calories and 130 grams of protein daily.   Exercise goals: As is.  Behavioral modification strategies: increasing lean protein intake and no skipping meals.  [name] has agreed to follow-up with our clinic in 2 to 3 weeks. He was informed of the importance of frequent follow-up visits to maximize his success with intensive lifestyle modifications for his multiple health conditions.   Objective:   Blood pressure 116/69, pulse 65, temperature 98.2 F (36.8 C), height 6\' 5"  (1.956 m), weight (!) 329 lb (149.2 kg), SpO2 97 %. Body mass index is 39.01 kg/m.  General: Cooperative, alert, well developed, in no acute distress. HEENT: Conjunctivae and lids unremarkable. Cardiovascular: Regular rhythm.  Lungs: Normal work of breathing. Neurologic: No focal deficits.   Lab Results  Component Value Date   CREATININE 0.92 05/13/2021   BUN 15 05/13/2021   NA 140 05/13/2021   K 4.8 05/13/2021   CL 102 05/13/2021   CO2 23 05/13/2021   Lab Results  Component Value Date   ALT 15 05/13/2021   AST 21 05/13/2021   ALKPHOS 59 05/13/2021   BILITOT 0.3 05/13/2021   Lab Results  Component Value  Date   HGBA1C 5.9 (H) 05/13/2021   HGBA1C 5.8 (H) 10/24/2017   Lab Results  Component Value Date   INSULIN 15.6 05/13/2021   Lab Results  Component Value Date   TSH <0.006 (L) 10/24/2017   Lab Results  Component Value Date   CHOL 195 05/13/2021   HDL 60 05/13/2021   LDLCALC 123 (H) 05/13/2021   TRIG 67 05/13/2021   Lab  Results  Component Value Date   VD25OH 28.1 (L) 05/13/2021   Lab Results  Component Value Date   WBC 5.1 05/13/2021   HGB 14.8 05/13/2021   HCT 44.4 05/13/2021   MCV 87 05/13/2021   PLT 272 05/13/2021   No results found for: IRON, TIBC, FERRITIN  Attestation Statements:   Reviewed by clinician on day of visit: allergies, medications, problem list, medical history, surgical history, family history, social history, and previous encounter notes.   I, Trixie Dredge, am acting as transcriptionist for Dennard Nip, MD.  I have reviewed the above documentation for accuracy and completeness, and I agree with the above. -  Dennard Nip, MD

## 2021-08-25 MED ORDER — VITAMIN D (ERGOCALCIFEROL) 1.25 MG (50000 UNIT) PO CAPS: 50000.0000 [IU] | ORAL_CAPSULE | ORAL | 0 refills | Status: DC

## 2021-09-09 ENCOUNTER — Ambulatory Visit (INDEPENDENT_AMBULATORY_CARE_PROVIDER_SITE_OTHER): Payer: 59 | Admitting: Family Medicine

## 2021-09-23 ENCOUNTER — Other Ambulatory Visit: Payer: Self-pay

## 2021-09-23 ENCOUNTER — Ambulatory Visit: Payer: 59 | Admitting: Podiatry

## 2021-09-23 DIAGNOSIS — L600 Ingrowing nail: Secondary | ICD-10-CM | POA: Diagnosis not present

## 2021-09-27 ENCOUNTER — Encounter: Payer: Self-pay | Admitting: Podiatry

## 2021-09-27 NOTE — Progress Notes (Signed)
  Subjective:  Patient ID: [name], male    DOB: [birth year],  MRN: [id number]  Chief Complaint  Patient presents with   Ingrown Toenail    NP L great toe pain - thinks its an ingrown     50 y.o. male presents with the above complaint. History confirmed with patient.  Has chronic ingrown toenail that pedicurist often trims out but it is usually still painful.  Objective:  Physical Exam: warm, good capillary refill, no trophic changes or ulcerative lesions, normal DP and PT pulses, and normal sensory exam. Left Foot: Medial border ingrowing no paronychia noted Assessment:   1. Ingrowing left great toenail      Plan:  Patient was evaluated and treated and all questions answered.  Discussed treatment for ingrown toenails including permanent partial nail avulsion which I recommended.  He has a trip upcoming this weekend and would like to plan to do this after he is back before Christmas.  I will see him back that week and we will plan to do a partial permanent nail avulsion.  Return in about 1 month (around 10/26/2021) for ingrown nail procedure .

## 2021-10-07 ENCOUNTER — Other Ambulatory Visit (INDEPENDENT_AMBULATORY_CARE_PROVIDER_SITE_OTHER): Payer: Self-pay | Admitting: Family Medicine

## 2021-10-07 DIAGNOSIS — E559 Vitamin D deficiency, unspecified: Secondary | ICD-10-CM

## 2021-10-11 DIAGNOSIS — Z0289 Encounter for other administrative examinations: Secondary | ICD-10-CM

## 2021-10-14 ENCOUNTER — Ambulatory Visit (INDEPENDENT_AMBULATORY_CARE_PROVIDER_SITE_OTHER): Payer: 59 | Admitting: Family Medicine

## 2021-10-26 ENCOUNTER — Ambulatory Visit (INDEPENDENT_AMBULATORY_CARE_PROVIDER_SITE_OTHER): Payer: 59 | Admitting: Podiatry

## 2021-10-26 DIAGNOSIS — Z91199 Patient's noncompliance with other medical treatment and regimen due to unspecified reason: Secondary | ICD-10-CM

## 2021-10-28 NOTE — Progress Notes (Signed)
Patient was no-show for appointment today 

## 2021-11-24 NOTE — Progress Notes (Deleted)
PATIENT: [name] DOB: 02/12/1971  REASON FOR VISIT: Follow up HISTORY FROM: Patient PRIMARY NEUROLOGIST: Dr. Krista Blue   HISTORY  [name] is a 51 year old male, seen in request by his primary care physician Dr. Harrington Challenger, Dwyane Luo, for evaluation of migraine headaches, initial evaluation was August 12, 2019,   I reviewed and summarized the referring note. Chronic migraine OSA CPAP machine HTN Morbid obesity   He had a history of chronic migraine for many years, become more frequent in recent years, about 1-2 times every week, usually starting at the frontal region, pressure, move around to different spot, and headache he also has nausea, despite medications,  lasting about 4 to 5 hours, is usually associated with hungry, dehydration will trigger the headache   He has tried Excedrin Migraine with limited help,   This migraine started at age 63, he also complains of loud snoring, poor sleep quality, complains of depression, anxiety   UPDATE May 13 2021: He only takes low-dose nortriptyline 10 mg every night instead of titrating it up, he complains of frequent headaches, 1-3 times each week, use Fioricet as needed, getting prescription from Dr. Harrington Challenger,  He also complains of difficulty sleeping due to obesity, obstructive sleep apnea, had a history of PTSD, claustrophobia, difficult for him to compliant with CPAP machine, is under the care of Dr. Elenore Rota,  Update November 25, 2021 SS:   REVIEW OF SYSTEMS: Out of a complete 14 system review of symptoms, the patient complains only of the following symptoms, and all other reviewed systems are negative.  ALLERGIES: No Known Allergies  HOME MEDICATIONS: Outpatient Medications Prior to Visit  Medication Sig Dispense Refill   Butalbital-APAP-Caffeine 50-325-40 MG capsule Take 1 capsule by mouth as needed.     fluticasone (FLONASE) 50 MCG/ACT nasal spray Place 2 sprays into both nostrils as needed.      Loratadine  (CLARITIN PO) Take 10 mg by mouth daily.     Multiple Vitamins-Minerals (CENTRUM SILVER 50+MEN) TABS Take 1 tablet by mouth daily.     nortriptyline (PAMELOR) 25 MG capsule Take 2 capsules (50 mg total) by mouth at bedtime. 60 capsule 11   ondansetron (ZOFRAN ODT) 4 MG disintegrating tablet Take 1 tablet (4 mg total) by mouth every 8 (eight) hours as needed for nausea or vomiting. 20 tablet 6   rizatriptan (MAXALT-MLT) 10 MG disintegrating tablet Take 1 tablet (10 mg total) by mouth as needed. May repeat in 2 hours if needed 15 tablet 6   Vitamin D, Ergocalciferol, (DRISDOL) 1.25 MG (50000 UNIT) CAPS capsule Take 1 capsule (50,000 Units total) by mouth every 7 (seven) days. 4 capsule 0   zolpidem (AMBIEN) 5 MG tablet Take 5 mg by mouth at bedtime as needed for sleep.     No facility-administered medications prior to visit.    PAST MEDICAL HISTORY: Past Medical History:  Diagnosis Date   Arthritis    Atypical chest pain 10/24/2017   Atypical chest pain 10/24/2017   Back pain    Depression    GERD (gastroesophageal reflux disease)    occ   Graves disease 12/29/2017   Joint pain    Left knee pain    Migraine    Morbid obesity ([place]) 10/24/2017   Neck pain    Night sweats 10/24/2017   Numbness and tingling in left arm    Numbness and tingling in left hand    OSA (obstructive sleep apnea) 10/24/2017   Other fatigue  Polyuria 10/24/2017   PTSD (post-traumatic stress disorder)    Seasonal allergies    Sleep apnea    cpap 1 yr   Unintentional weight loss 10/24/2017    PAST SURGICAL HISTORY: Past Surgical History:  Procedure Laterality Date   Epidural Steroid Injections     KNEE SURGERY Left    arthroscopy (Pt reports 3 left knee procedures) last one done on 09/16/2020)   SHOULDER ARTHROSCOPY WITH DEBRIDEMENT AND BICEP TENDON REPAIR     SHOULDER ARTHROSCOPY WITH SUBACROMIAL DECOMPRESSION, ROTATOR CUFF REPAIR AND BICEP TENDON REPAIR Right 04/09/2015   Procedure: RIGHT SHOULDER  ARTHROSCOPY WITH SUBACROMIAL DECOMPRESSION, DISTAL  CLAVICLE RESECTION LABRAL AND ROTATOR CUFF DEBRIDEMENT;  Surgeon: Justice Britain, MD;  Location: [facility];  Service: Orthopedics;  Laterality: Right;   SHOULDER SURGERY Left    arthroscopy    FAMILY HISTORY: Family History  Problem Relation Age of Onset   Obesity Mother    Hypertension Mother    Ovarian cancer Mother    Cancer Mother        ovarian   Thyroid disease Mother    Drug abuse Father    Alcoholism Father    Cancer Father    Hypertension Father    Stomach cancer Father    Hypertension Sister    Hypertension Brother    Stroke Maternal Grandmother    Diabetes Maternal Grandmother    Heart attack Maternal Grandfather    Alcoholism Other    Drug abuse Other    Obesity Other     SOCIAL HISTORY: Social History   Socioeconomic History   Marital status: Married    Spouse name: Angie   Number of children: 2   Years of education: College    Highest education level: Not on file  Occupational History   Occupation: General Floor Help - [employer]   Occupation: [employer] engineer  Tobacco Use   Smoking status: Former    Packs/day: 0.25    Years: 5.00    Pack years: 1.25    Types: Cigarettes    Quit date: 03/29/2005    Years since quitting: 16.6   Smokeless tobacco: Former  Scientific laboratory technician Use: Never used  Substance and Sexual Activity   Alcohol use: Yes    Alcohol/week: 0.0 standard drinks    Comment: Social use - rare   Drug use: No   Sexual activity: Yes    Partners: Female  Other Topics Concern   Not on file  Social History Narrative   Lives with wife and two children.   Right-handed.   Occasional use of caffeine- soda   Social Determinants of Health   Financial Resource Strain: Not on file  Food Insecurity: Not on file  Transportation Needs: Not on file  Physical Activity: Not on file  Stress: Not on file  Social Connections: Not on file  Intimate Partner Violence: Not on file      PHYSICAL  EXAM  There were no vitals filed for this visit. There is no height or weight on file to calculate BMI.  Generalized: Well developed, in no acute distress   Neurological examination  Mentation: Alert oriented to time, place, history taking. Follows all commands speech and language fluent Cranial nerve II-XII: Pupils were equal round reactive to light. Extraocular movements were full, visual field were full on confrontational test. Facial sensation and strength were normal. Uvula tongue midline. Head turning and shoulder shrug  were normal and symmetric. Motor: The motor testing reveals 5 over 5  strength of all 4 extremities. Good symmetric motor tone is noted throughout.  Sensory: Sensory testing is intact to soft touch on all 4 extremities. No evidence of extinction is noted.  Coordination: Cerebellar testing reveals good finger-nose-finger and heel-to-shin bilaterally.  Gait and station: Gait is normal. Tandem gait is normal. Romberg is negative. No drift is seen.  Reflexes: Deep tendon reflexes are symmetric and normal bilaterally.   DIAGNOSTIC DATA (LABS, IMAGING, TESTING) - I reviewed patient records, labs, notes, testing and imaging myself where available.  Lab Results  Component Value Date   WBC 5.1 05/13/2021   HGB 14.8 05/13/2021   HCT 44.4 05/13/2021   MCV 87 05/13/2021   PLT 272 05/13/2021      Component Value Date/Time   NA 140 05/13/2021 0946   K 4.8 05/13/2021 0946   CL 102 05/13/2021 0946   CO2 23 05/13/2021 0946   GLUCOSE 82 05/13/2021 0946   GLUCOSE 91 03/30/2015 1130   BUN 15 05/13/2021 0946   CREATININE 0.92 05/13/2021 0946   CALCIUM 9.7 05/13/2021 0946   PROT 7.1 05/13/2021 0946   ALBUMIN 4.7 05/13/2021 0946   AST 21 05/13/2021 0946   ALT 15 05/13/2021 0946   ALKPHOS 59 05/13/2021 0946   BILITOT 0.3 05/13/2021 0946   GFRNONAA 86 08/02/2018 0837   GFRAA 99 08/02/2018 0837   Lab Results  Component Value Date   CHOL 195 05/13/2021   HDL 60 05/13/2021    LDLCALC 123 (H) 05/13/2021   TRIG 67 05/13/2021   Lab Results  Component Value Date   HGBA1C 5.9 (H) 05/13/2021   Lab Results  Component Value Date   VITAMINB12 393 05/13/2021   Lab Results  Component Value Date   TSH <0.006 (L) 10/24/2017      ASSESSMENT AND PLAN 51 y.o. year old male  has a past medical history of Arthritis, Atypical chest pain (10/24/2017), Atypical chest pain (10/24/2017), Back pain, Depression, GERD (gastroesophageal reflux disease), Graves disease (12/29/2017), Joint pain, Left knee pain, Migraine, Morbid obesity ([place]) (10/24/2017), Neck pain, Night sweats (10/24/2017), Numbness and tingling in left arm, Numbness and tingling in left hand, OSA (obstructive sleep apnea) (10/24/2017), Other fatigue, Polyuria (10/24/2017), PTSD (post-traumatic stress disorder), Seasonal allergies, Sleep apnea, and Unintentional weight loss (10/24/2017). here with:  1.  Chronic migraine 2.  OSA     Butler Denmark, AGNP-C, DNP 11/24/2021, 10:33 AM [facility] [address], [place], [place] [address] [phone]

## 2021-11-25 ENCOUNTER — Ambulatory Visit (INDEPENDENT_AMBULATORY_CARE_PROVIDER_SITE_OTHER): Payer: 59 | Admitting: Family Medicine

## 2021-11-25 ENCOUNTER — Encounter (INDEPENDENT_AMBULATORY_CARE_PROVIDER_SITE_OTHER): Payer: Self-pay | Admitting: Family Medicine

## 2021-11-25 ENCOUNTER — Encounter: Payer: Self-pay | Admitting: Neurology

## 2021-11-25 ENCOUNTER — Ambulatory Visit: Payer: 59 | Admitting: Neurology

## 2021-11-25 ENCOUNTER — Other Ambulatory Visit: Payer: Self-pay

## 2021-11-25 VITALS — BP 121/61 | HR 55 | Temp 98.5°F | Ht 77.0 in | Wt 335.0 lb

## 2021-11-25 DIAGNOSIS — E559 Vitamin D deficiency, unspecified: Secondary | ICD-10-CM

## 2021-11-25 DIAGNOSIS — Z6839 Body mass index (BMI) 39.0-39.9, adult: Secondary | ICD-10-CM | POA: Diagnosis not present

## 2021-11-25 DIAGNOSIS — G4733 Obstructive sleep apnea (adult) (pediatric): Secondary | ICD-10-CM | POA: Diagnosis not present

## 2021-11-25 DIAGNOSIS — Z9189 Other specified personal risk factors, not elsewhere classified: Secondary | ICD-10-CM | POA: Diagnosis not present

## 2021-11-25 DIAGNOSIS — E669 Obesity, unspecified: Secondary | ICD-10-CM

## 2021-11-25 MED ORDER — VITAMIN D (ERGOCALCIFEROL) 1.25 MG (50000 UNIT) PO CAPS: 50000.0000 [IU] | ORAL_CAPSULE | ORAL | 0 refills | Status: DC

## 2021-11-25 NOTE — Progress Notes (Signed)
Chief Complaint:   OBESITY [name] is here to discuss his progress with his obesity treatment plan along with follow-up of his obesity related diagnoses. [name] is on keeping a food journal and adhering to recommended goals of 2000 calories and 130 grams of protein daily and states he is following his eating plan approximately 0% of the time. [name] states he is at the gym for 60-90 minutes 3 times per week.  Today's visit was #: 5 Starting weight: 334 lbs Starting date: 05/13/2021 Today's weight: 335 lbs Today's date: 11/25/2021 Total lbs lost to date: 0 Total lbs lost since last in-office visit: 0  Interim History: [name]'s last visit was approximately 4 months ago. He is up 6 lbs, but he has been exercising more and he notes his clothes are much looser. Our bioimpedance scale suggests that his weight gain is in fact due to increased muscle mass. He has been trying to portion control and make smarter choices, and trying to increase his vegetables and protein.  Subjective:   1. Vitamin D deficiency [name] is on Vit D, and he is due for labs.  2. OSA (obstructive sleep apnea) [name] has obstructive sleep apnea, but he is unable to wear his CPAP due to claustrophobia. He is seeing a sleep therapist to help with his insomnia.  3. At risk for heart disease [name] is at a higher than average risk for cardiovascular disease due to obesity.   Assessment/Plan:   1. Vitamin D deficiency Low Vitamin D level contributes to fatigue and are associated with obesity, breast, and colon cancer. We will check labs today, and we will refill prescription Vitamin D for 1 month. [name] will follow-up for routine testing of Vitamin D, at least 2-3 times per year to avoid over-replacement.  - Vitamin D, Ergocalciferol, (DRISDOL) 1.25 MG (50000 UNIT) CAPS capsule; Take 1 capsule (50,000 Units total) by mouth every 7 (seven) days.  Dispense: 4 capsule; Refill: 0 -  VITAMIN D 25 Hydroxy (Vit-D Deficiency, Fractures)  2. OSA (obstructive sleep apnea) [name] is to continue to work on weight loss to help decrease his obstructive sleep apnea. We will continue to follow.   3. At risk for heart disease [name] was given approximately 15 minutes of coronary artery disease prevention counseling today. He is 51 y.o. male and has risk factors for heart disease including obesity. We discussed intensive lifestyle modifications today with an emphasis on specific weight loss instructions and strategies.   Repetitive spaced learning was employed today to elicit superior memory formation and behavioral change.  4. Obesity with current BMI of 39.8 [name] is currently in the action stage of change. As such, his goal is to continue with weight loss efforts. He has agreed to keeping a food journal and adhering to recommended goals of 1500-1700 calories and 150+ grams of protein daily.   [name] is to work on increasing "real food" protein.  Exercise goals: As is.  Behavioral modification strategies: increasing lean protein intake.  [name] has agreed to follow-up with our clinic in 4 weeks. He was informed of the importance of frequent follow-up visits to maximize his success with intensive lifestyle modifications for his multiple health conditions.   [name] was informed we would discuss his lab results at his next visit unless there is a critical issue that needs to be addressed sooner. [name] agreed to keep his next visit at the agreed upon time to discuss these results.  Objective:   Blood pressure 121/61, pulse Marland Kitchen)  55, temperature 98.5 F (36.9 C), height 6\' 5"  (1.956 m), weight (!) 335 lb (152 kg), SpO2 97 %. Body mass index is 39.73 kg/m.  General: Cooperative, alert, well developed, in no acute distress. HEENT: Conjunctivae and lids unremarkable. Cardiovascular: Regular rhythm.  Lungs: Normal work of breathing. Neurologic: No  focal deficits.   Lab Results  Component Value Date   CREATININE 0.92 05/13/2021   BUN 15 05/13/2021   NA 140 05/13/2021   K 4.8 05/13/2021   CL 102 05/13/2021   CO2 23 05/13/2021   Lab Results  Component Value Date   ALT 15 05/13/2021   AST 21 05/13/2021   ALKPHOS 59 05/13/2021   BILITOT 0.3 05/13/2021   Lab Results  Component Value Date   HGBA1C 5.9 (H) 05/13/2021   HGBA1C 5.8 (H) 10/24/2017   Lab Results  Component Value Date   INSULIN 15.6 05/13/2021   Lab Results  Component Value Date   TSH <0.006 (L) 10/24/2017   Lab Results  Component Value Date   CHOL 195 05/13/2021   HDL 60 05/13/2021   LDLCALC 123 (H) 05/13/2021   TRIG 67 05/13/2021   Lab Results  Component Value Date   VD25OH 28.1 (L) 05/13/2021   Lab Results  Component Value Date   WBC 5.1 05/13/2021   HGB 14.8 05/13/2021   HCT 44.4 05/13/2021   MCV 87 05/13/2021   PLT 272 05/13/2021   No results found for: IRON, TIBC, FERRITIN  Attestation Statements:   Reviewed by clinician on day of visit: allergies, medications, problem list, medical history, surgical history, family history, social history, and previous encounter notes.   I, Trixie Dredge, am acting as transcriptionist for Dennard Nip, MD.  I have reviewed the above documentation for accuracy and completeness, and I agree with the above. -  Dennard Nip, MD

## 2021-11-26 LAB — VITAMIN D 25 HYDROXY (VIT D DEFICIENCY, FRACTURES): Vit D, 25-Hydroxy: 32.6 ng/mL (ref 30.0–100.0)

## 2021-12-23 ENCOUNTER — Ambulatory Visit (INDEPENDENT_AMBULATORY_CARE_PROVIDER_SITE_OTHER): Payer: 59 | Admitting: Family Medicine

## 2021-12-23 ENCOUNTER — Other Ambulatory Visit: Payer: Self-pay

## 2021-12-23 ENCOUNTER — Encounter (INDEPENDENT_AMBULATORY_CARE_PROVIDER_SITE_OTHER): Payer: Self-pay | Admitting: Family Medicine

## 2021-12-23 VITALS — BP 130/69 | HR 57 | Temp 98.1°F | Ht 77.0 in | Wt 340.0 lb

## 2021-12-23 DIAGNOSIS — Z6839 Body mass index (BMI) 39.0-39.9, adult: Secondary | ICD-10-CM

## 2021-12-23 DIAGNOSIS — E669 Obesity, unspecified: Secondary | ICD-10-CM

## 2021-12-23 DIAGNOSIS — Z6841 Body Mass Index (BMI) 40.0 and over, adult: Secondary | ICD-10-CM

## 2021-12-23 DIAGNOSIS — Z9189 Other specified personal risk factors, not elsewhere classified: Secondary | ICD-10-CM

## 2021-12-23 DIAGNOSIS — E559 Vitamin D deficiency, unspecified: Secondary | ICD-10-CM

## 2021-12-23 MED ORDER — VITAMIN D (ERGOCALCIFEROL) 1.25 MG (50000 UNIT) PO CAPS: 50000.0000 [IU] | ORAL_CAPSULE | ORAL | 0 refills | Status: DC

## 2021-12-23 NOTE — Progress Notes (Signed)
Chief Complaint:   OBESITY [name] is here to discuss his progress with his obesity treatment plan along with follow-up of his obesity related diagnoses. [name] is on keeping a food journal and adhering to recommended goals of 1500-1700 calories and 150+ grams of protein daily and states he is following his eating plan approximately (unknown)% of the time. [name] states he at the gym 3-4 times per week.   Today's visit was #: 6 Starting weight: 334 lbs Starting date: 05/13/2021 Today's weight: 340 lbs Today's date: 12/23/2021 Total lbs lost to date: 0 Total lbs lost since last in-office visit: 0  Interim History: [name] hasn't been able to journal, but he has tried to be mindful. He has been drinking protein shakes to help increase his protein. He indulged some over the Super Bowl. 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  Subjective:   1. Vitamin D deficiency [name]'s Vit D level is slowly improving, but is not yet at goal.  2. At risk for osteoporosis [name] is at higher risk of osteopenia and osteoporosis due to Vitamin D deficiency.   Assessment/Plan:   1. Vitamin D deficiency Low Vitamin D level contributes to fatigue and are associated with obesity, breast, and colon cancer. We will refill prescription Vitamin D for 1 month. [name] will follow-up for routine testing of Vitamin D, at least 2-3 times per year to avoid over-replacement.  - Vitamin D, Ergocalciferol, (DRISDOL) 1.25 MG (50000 UNIT) CAPS capsule; Take 1 capsule (50,000 Units total) by mouth every 7 (seven) days.  Dispense: 4 capsule; Refill: 0  2. At risk for osteoporosis [name] was given approximately 15 minutes of osteoporosis prevention counseling today. [name] is at risk for osteopenia and osteoporosis due to his Vitamin D deficiency. He was encouraged to take his Vit D and follow his higher calcium diet and increase strengthening exercise to help  strengthen his bones and decrease his risk of osteopenia and osteoporosis.  3. Obesity with current BMI of 40.4 [name] is currently in the action stage of change. As such, his goal is to continue with weight loss efforts. He has agreed to change to following a lower carbohydrate, vegetable and lean protein rich diet plan.   Exercise goals: As is.  Behavioral modification strategies: increasing lean protein intake and meal planning and cooking strategies.  [name] has agreed to follow-up with our clinic in 3 to 4 weeks. He was informed of the importance of frequent follow-up visits to maximize his success with intensive lifestyle modifications for his multiple health conditions.   Objective:   Blood pressure 130/69, pulse (!) 57, temperature 98.1 F (36.7 C), height 6\' 5"  (1.956 m), weight (!) 340 lb (154.2 kg), SpO2 97 %. Body mass index is 40.32 kg/m.  General: Cooperative, alert, well developed, in no acute distress. HEENT: Conjunctivae and lids unremarkable. Cardiovascular: Regular rhythm.  Lungs: Normal work of breathing. Neurologic: No focal deficits.   Lab Results  Component Value Date   CREATININE 0.92 05/13/2021   BUN 15 05/13/2021   NA 140 05/13/2021   K 4.8 05/13/2021   CL 102 05/13/2021   CO2 23 05/13/2021   Lab Results  Component Value Date   ALT 15 05/13/2021   AST 21 05/13/2021   ALKPHOS 59 05/13/2021   BILITOT 0.3 05/13/2021   Lab Results  Component Value Date   HGBA1C 5.9 (H) 05/13/2021   HGBA1C 5.8 (H) 10/24/2017   Lab Results  Component Value Date   INSULIN 15.6  05/13/2021   Lab Results  Component Value Date   TSH <0.006 (L) 10/24/2017   Lab Results  Component Value Date   CHOL 195 05/13/2021   HDL 60 05/13/2021   LDLCALC 123 (H) 05/13/2021   TRIG 67 05/13/2021   Lab Results  Component Value Date   VD25OH 32.6 11/25/2021   VD25OH 28.1 (L) 05/13/2021   Lab Results  Component Value Date   WBC 5.1 05/13/2021   HGB 14.8  05/13/2021   HCT 44.4 05/13/2021   MCV 87 05/13/2021   PLT 272 05/13/2021   No results found for: IRON, TIBC, FERRITIN  Attestation Statements:   Reviewed by clinician on day of visit: allergies, medications, problem list, medical history, surgical history, family history, social history, and previous encounter notes.   I, Trixie Dredge, am acting as transcriptionist for Dennard Nip, MD.  I have reviewed the above documentation for accuracy and completeness, and I agree with the above. -  Dennard Nip, MD

## 2022-01-13 ENCOUNTER — Encounter (INDEPENDENT_AMBULATORY_CARE_PROVIDER_SITE_OTHER): Payer: Self-pay

## 2022-01-20 ENCOUNTER — Ambulatory Visit (INDEPENDENT_AMBULATORY_CARE_PROVIDER_SITE_OTHER): Payer: 59 | Admitting: Family Medicine

## 2022-02-14 DIAGNOSIS — Z0289 Encounter for other administrative examinations: Secondary | ICD-10-CM

## 2022-02-16 ENCOUNTER — Ambulatory Visit: Payer: 59 | Admitting: Neurology

## 2022-02-16 ENCOUNTER — Telehealth: Payer: Self-pay | Admitting: *Deleted

## 2022-02-16 VITALS — BP 141/69 | HR 73 | Ht 77.0 in | Wt 324.0 lb

## 2022-02-16 DIAGNOSIS — G43709 Chronic migraine without aura, not intractable, without status migrainosus: Secondary | ICD-10-CM | POA: Diagnosis not present

## 2022-02-16 DIAGNOSIS — G4733 Obstructive sleep apnea (adult) (pediatric): Secondary | ICD-10-CM

## 2022-02-16 MED ORDER — ALPRAZOLAM 0.5 MG PO TABS: ORAL_TABLET | ORAL | 0 refills | Status: DC

## 2022-02-16 MED ORDER — AJOVY 225 MG/1.5ML ~~LOC~~ SOAJ: 225.0000 mg | SUBCUTANEOUS | 11 refills | Status: DC

## 2022-02-16 MED ORDER — NURTEC 75 MG PO TBDP: 75.0000 mg | ORAL_TABLET | ORAL | 11 refills | Status: AC | PRN

## 2022-02-16 NOTE — Progress Notes (Signed)
? ? ?Patient: [name] ?Date of Birth: [birth year] ? ?Reason for Visit: Follow up for migraines ?History from: Patient ?Primary Neurologist: Dr. Krista Blue  ? ?ASSESSMENT AND PLAN ?51 y.o. year old male  ?1.  Chronic migraine headache, about 15 headache days a month ?-Add on Ajovy 225 mg monthly injection for migraine prevention ?-Try Nurtec 75 mg tablet for acute headache treatment ?-Has tried and failed nortriptyline, Effexor, metoprolol, Topamax, Maxalt, Imitrex, Fioricet ?-Check MRI of the brain with and without contrast due to continued frequent headache despite multiple medications, new onset nighttime headache last few weeks ?-Check creatinine today before MRI with contrast, will send in Xanax due to claustrophobia ?-Some of these headaches likely related to untreated OSA (see below) ? ?2.  Obstructive sleep apnea ?-Not able to tolerate CPAP due to claustrophobia, seeing Dr. Maxwell Caul ?-Seeing CBT for chronic insomnia, on Ambien PRN ?-Reportedly not a candidate for INSPIRE due to BMI ? ?Meds ordered this encounter  ?Medications  ? Fremanezumab-vfrm (AJOVY) 225 MG/1.5ML SOAJ  ?  Sig: Inject 225 mg into the skin every 30 (thirty) days.  ?  Dispense:  1.68 mL  ?  Refill:  11  ? Rimegepant Sulfate (NURTEC) 75 MG TBDP  ?  Sig: Take 75 mg by mouth as needed (Take 1 tablet at onset of headache, max is 75 mg in 24 hours).  ?  Dispense:  8 tablet  ?  Refill:  11  ? ALPRAZolam (XANAX) 0.5 MG tablet  ?  Sig: Take 1-2 tablets 45 minutes before MRI, may take a 3rd if needed before going into scanner, MUST HAVE DRIVER  ?  Dispense:  3 tablet  ?  Refill:  0  ? ? ?HISTORY ?[name] is a 51 year old male, seen in request by his primary care physician Dr. Harrington Challenger, Dwyane Luo, for evaluation of migraine headaches, initial evaluation was August 12, 2019, ?  ?I reviewed and summarized the referring note. ?Chronic migraine ?OSA CPAP machine ?HTN ?Morbid obesity ?  ?He had a history of chronic migraine for many years,  become more frequent in recent years, about 1-2 times every week, usually starting at the frontal region, pressure, move around to different spot, and headache he also has nausea, despite medications,  lasting about 4 to 5 hours, is usually associated with hungry, dehydration will trigger the headache ?  ?He has tried Excedrin Migraine with limited help, ?  ?This migraine started at age 56, he also complains of loud snoring, poor sleep quality, complains of depression, anxiety ?  ?UPDATE May 13 2021: ?He only takes low-dose nortriptyline 10 mg every night instead of titrating it up, he complains of frequent headaches, 1-3 times each week, use Fioricet as needed, getting prescription from Dr. Harrington Challenger, ? ?He also complains of difficulty sleeping due to obesity, obstructive sleep apnea, had a history of PTSD, claustrophobia, difficult for him to compliant with CPAP machine, is under the care of Dr. Elenore Rota, ? ?Update February 16, 2022. SS: Waking up around 12 AM with headache, feels in occipital area, left sided for last several weeks but none for last 2 night; gets same headache during day frontal, triggers with weather change, bright light, Fioricet doesn't really help. Was taking nortriptyline 50 mg QHS, not sure it helped? Was still having 15 headaches per month. has PTSD seeing therapist weekly. Seeing CBT for insomnia. Sees Dr. Maxwell Caul at [place], can't use CPAP, trying to sleep from 11 PM-4 AM with sleep script.  ? ?REVIEW OF SYSTEMS:  Out of a complete 14 system review of symptoms, the patient complains only of the following symptoms, and all other reviewed systems are negative. ? ?See HPI ? ?ALLERGIES: ?No Known Allergies ? ?HOME MEDICATIONS: ?Outpatient Medications Prior to Visit  ?Medication Sig Dispense Refill  ? Butalbital-APAP-Caffeine 50-325-40 MG capsule Take 1 capsule by mouth as needed.    ? fluticasone (FLONASE) 50 MCG/ACT nasal spray Place 2 sprays into both nostrils as needed.     ? Loratadine (CLARITIN PO)  Take 10 mg by mouth daily.    ? Multiple Vitamins-Minerals (CENTRUM SILVER 50+MEN) TABS Take 1 tablet by mouth daily.    ? ondansetron (ZOFRAN ODT) 4 MG disintegrating tablet Take 1 tablet (4 mg total) by mouth every 8 (eight) hours as needed for nausea or vomiting. 20 tablet 6  ? Vitamin D, Ergocalciferol, (DRISDOL) 1.25 MG (50000 UNIT) CAPS capsule Take 1 capsule (50,000 Units total) by mouth every 7 (seven) days. 4 capsule 0  ? zolpidem (AMBIEN) 5 MG tablet Take 5 mg by mouth at bedtime as needed for sleep.    ? nortriptyline (PAMELOR) 25 MG capsule Take 2 capsules (50 mg total) by mouth at bedtime. 60 capsule 11  ? rizatriptan (MAXALT-MLT) 10 MG disintegrating tablet Take 1 tablet (10 mg total) by mouth as needed. May repeat in 2 hours if needed 15 tablet 6  ? ?No facility-administered medications prior to visit.  ? ? ?PAST MEDICAL HISTORY: ?Past Medical History:  ?Diagnosis Date  ? Arthritis   ? Atypical chest pain 10/24/2017  ? Atypical chest pain 10/24/2017  ? Back pain   ? Depression   ? GERD (gastroesophageal reflux disease)   ? occ  ? Graves disease 12/29/2017  ? Joint pain   ? Left knee pain   ? Migraine   ? Morbid obesity ([place]) 10/24/2017  ? Neck pain   ? Night sweats 10/24/2017  ? Numbness and tingling in left arm   ? Numbness and tingling in left hand   ? OSA (obstructive sleep apnea) 10/24/2017  ? Other fatigue   ? Polyuria 10/24/2017  ? PTSD (post-traumatic stress disorder)   ? Seasonal allergies   ? Sleep apnea   ? cpap 1 yr  ? Unintentional weight loss 10/24/2017  ? ? ?PAST SURGICAL HISTORY: ?Past Surgical History:  ?Procedure Laterality Date  ? Epidural Steroid Injections    ? KNEE SURGERY Left   ? arthroscopy (Pt reports 3 left knee procedures) last one done on 09/16/2020)  ? SHOULDER ARTHROSCOPY WITH DEBRIDEMENT AND BICEP TENDON REPAIR    ? SHOULDER ARTHROSCOPY WITH SUBACROMIAL DECOMPRESSION, ROTATOR CUFF REPAIR AND BICEP TENDON REPAIR Right 04/09/2015  ? Procedure: RIGHT SHOULDER ARTHROSCOPY  WITH SUBACROMIAL DECOMPRESSION, DISTAL  CLAVICLE RESECTION LABRAL AND ROTATOR CUFF DEBRIDEMENT;  Surgeon: Justice Britain, MD;  Location: [facility];  Service: Orthopedics;  Laterality: Right;  ? SHOULDER SURGERY Left   ? arthroscopy  ? ? ?FAMILY HISTORY: ?Family History  ?Problem Relation Age of Onset  ? Obesity Mother   ? Hypertension Mother   ? Ovarian cancer Mother   ? Cancer Mother   ?     ovarian  ? Thyroid disease Mother   ? Drug abuse Father   ? Alcoholism Father   ? Cancer Father   ? Hypertension Father   ? Stomach cancer Father   ? Hypertension Sister   ? Hypertension Brother   ? Stroke Maternal Grandmother   ? Diabetes Maternal Grandmother   ? Heart attack Maternal Grandfather   ?  Alcoholism Other   ? Drug abuse Other   ? Obesity Other   ? ? ?SOCIAL HISTORY: ?Social History  ? ?Socioeconomic History  ? Marital status: Married  ?  Spouse name: Angie  ? Number of children: 2  ? Years of education: College   ? Highest education level: Not on file  ?Occupational History  ? Occupation: General Floor Help - [employer]  ? Occupation: Building control surveyor  ?Tobacco Use  ? Smoking status: Former  ?  Packs/day: 0.25  ?  Years: 5.00  ?  Pack years: 1.25  ?  Types: Cigarettes  ?  Quit date: 03/29/2005  ?  Years since quitting: 16.8  ? Smokeless tobacco: Former  ?Vaping Use  ? Vaping Use: Never used  ?Substance and Sexual Activity  ? Alcohol use: Yes  ?  Alcohol/week: 0.0 standard drinks  ?  Comment: Social use - rare  ? Drug use: No  ? Sexual activity: Yes  ?  Partners: Female  ?Other Topics Concern  ? Not on file  ?Social History Narrative  ? Lives with wife and two children.  ? Right-handed.  ? Occasional use of caffeine- soda  ? ?Social Determinants of Health  ? ?Financial Resource Strain: Not on file  ?Food Insecurity: Not on file  ?Transportation Needs: Not on file  ?Physical Activity: Not on file  ?Stress: Not on file  ?Social Connections: Not on file  ?Intimate Partner Violence: Not on file  ? ?PHYSICAL EXAM ? ?Vitals:  ?  02/16/22 1327  ?BP: (!) 141/69  ?Pulse: 73  ?Weight: (!) 324 lb (147 kg)  ?Height: '6\' 5"'$  (1.956 m)  ? ?Body mass index is 38.42 kg/m?. ? ?Generalized: Well developed, in no acute distress  ?Neurological exam

## 2022-02-16 NOTE — Telephone Encounter (Signed)
PA for Ajovy '225mg'$  started on covermymeds (key: [id number]). Pharmacy coverage through [place] [phone]). Decision pending.  ?

## 2022-02-16 NOTE — Patient Instructions (Addendum)
Add on Ajovy, once a month injection for migraine prevention  ?Try Nurtec for acute headache, 1 tablet onset of headache  ?Check MRI of the brain  ?See you back in 4 months  ?My chart message with any issues ? ?

## 2022-02-17 LAB — CREATININE, SERUM
Creatinine, Ser: 1.12 mg/dL (ref 0.76–1.27)
eGFR: 79 mL/min/{1.73_m2} (ref >59)

## 2022-02-17 NOTE — Telephone Encounter (Signed)
Approved through 05/18/2022. ?

## 2022-02-21 ENCOUNTER — Telehealth: Payer: Self-pay | Admitting: Neurology

## 2022-02-21 NOTE — Telephone Encounter (Signed)
[employer] auth: NPR via [employer] website order faxed to [facility], they will reach out to the patient to schedule ?

## 2022-02-21 NOTE — Progress Notes (Signed)
Chart reviewed, agree above plan ?

## 2022-02-23 NOTE — Telephone Encounter (Signed)
schedule for 4/27 815AM  ?

## 2022-02-24 ENCOUNTER — Other Ambulatory Visit: Payer: Self-pay | Admitting: Neurology

## 2022-03-08 ENCOUNTER — Telehealth: Payer: Self-pay | Admitting: Neurology

## 2022-03-08 NOTE — Telephone Encounter (Signed)
I called the patient.  MRI of the brain with and without contrast was normal.  He has done his first injection of Ajovy.  He did have 1 episode of acute migraine, took Nurtec with excellent benefit.  He is quite pleased. ? ?IMPRESSION:  ?1. Normal MRI of the brain  ?2. Mucosal thickening in the right ethmoid and maxillary sinuses   ?

## 2022-04-25 DIAGNOSIS — Z0289 Encounter for other administrative examinations: Secondary | ICD-10-CM

## 2022-04-27 ENCOUNTER — Telehealth: Payer: Self-pay | Admitting: *Deleted

## 2022-04-27 NOTE — Telephone Encounter (Signed)
Pt [employer] brand form completed and faxed.

## 2022-06-09 ENCOUNTER — Other Ambulatory Visit: Payer: Self-pay | Admitting: Orthopaedic Surgery

## 2022-06-09 DIAGNOSIS — M545 Low back pain, unspecified: Secondary | ICD-10-CM

## 2022-06-15 ENCOUNTER — Other Ambulatory Visit (INDEPENDENT_AMBULATORY_CARE_PROVIDER_SITE_OTHER): Payer: Self-pay | Admitting: Family Medicine

## 2022-06-15 ENCOUNTER — Other Ambulatory Visit: Payer: Self-pay | Admitting: Neurology

## 2022-06-15 ENCOUNTER — Encounter (INDEPENDENT_AMBULATORY_CARE_PROVIDER_SITE_OTHER): Payer: Self-pay

## 2022-06-15 DIAGNOSIS — E559 Vitamin D deficiency, unspecified: Secondary | ICD-10-CM

## 2022-06-16 ENCOUNTER — Telehealth: Payer: Self-pay

## 2022-06-16 NOTE — Telephone Encounter (Signed)
Pa for Ajovy has been sent via cmm.  (Key: [id number])  Your information has been submitted to [facility]. To check for an updated outcome later, reopen this PA request from your dashboard.  If [employer] has not responded to your request within 24 hours, contact [facility] at [phone]. If you think there may be a problem with your PA request, use our live chat feature at the bottom right.

## 2022-06-16 NOTE — Telephone Encounter (Signed)
PA approved from 06/16/22 to 06/17/23.

## 2022-06-18 ENCOUNTER — Ambulatory Visit
Admission: RE | Admit: 2022-06-18 | Discharge: 2022-06-18 | Disposition: A | Discharge status: Home / Self Care | Payer: 59 | Source: Ambulatory Visit | Attending: Orthopaedic Surgery | Admitting: Orthopaedic Surgery

## 2022-06-18 DIAGNOSIS — M545 Low back pain, unspecified: Secondary | ICD-10-CM

## 2022-07-05 ENCOUNTER — Other Ambulatory Visit: Payer: Self-pay

## 2022-07-05 ENCOUNTER — Encounter (HOSPITAL_BASED_OUTPATIENT_CLINIC_OR_DEPARTMENT_OTHER): Payer: Self-pay

## 2022-07-05 ENCOUNTER — Emergency Department (HOSPITAL_BASED_OUTPATIENT_CLINIC_OR_DEPARTMENT_OTHER)
Admission: EM | Admit: 2022-07-05 | Discharge: 2022-07-05 | Disposition: A | Discharge status: Home / Self Care | Payer: 59 | Attending: Emergency Medicine | Admitting: Emergency Medicine

## 2022-07-05 ENCOUNTER — Emergency Department (HOSPITAL_BASED_OUTPATIENT_CLINIC_OR_DEPARTMENT_OTHER): Payer: 59 | Admitting: Radiology

## 2022-07-05 DIAGNOSIS — Z79899 Other long term (current) drug therapy: Secondary | ICD-10-CM | POA: Insufficient documentation

## 2022-07-05 DIAGNOSIS — R079 Chest pain, unspecified: Secondary | ICD-10-CM | POA: Diagnosis present

## 2022-07-05 LAB — URINALYSIS, ROUTINE W REFLEX MICROSCOPIC
Bilirubin Urine: NEGATIVE
Glucose, UA: NEGATIVE mg/dL
Hgb urine dipstick: NEGATIVE
Ketones, ur: NEGATIVE mg/dL
Leukocytes,Ua: NEGATIVE
Nitrite: NEGATIVE
Protein, ur: NEGATIVE mg/dL
Specific Gravity, Urine: 1.005 (ref 1.005–1.030)
pH: 5 (ref 5.0–8.0)

## 2022-07-05 LAB — BASIC METABOLIC PANEL
Anion gap: 9 (ref 5–15)
BUN: 18 mg/dL (ref 6–20)
CO2: 25 mmol/L (ref 22–32)
Calcium: 9.6 mg/dL (ref 8.9–10.3)
Chloride: 102 mmol/L (ref 98–111)
Creatinine, Ser: 1.14 mg/dL (ref 0.61–1.24)
GFR, Estimated: >60 mL/min (ref >60)
Glucose, Bld: 109 mg/dL — ABNORMAL HIGH (ref 70–99)
Potassium: 3.9 mmol/L (ref 3.5–5.1)
Sodium: 136 mmol/L (ref 135–145)

## 2022-07-05 LAB — CBC
HCT: 41 % (ref 39.0–52.0)
Hemoglobin: 14 g/dL (ref 13.0–17.0)
MCH: 29.7 pg (ref 26.0–34.0)
MCHC: 34.1 g/dL (ref 30.0–36.0)
MCV: 86.9 fL (ref 80.0–100.0)
Platelets: 294 10*3/uL (ref 150–400)
RBC: 4.72 MIL/uL (ref 4.22–5.81)
RDW: 12.9 % (ref 11.5–15.5)
WBC: 6.7 10*3/uL (ref 4.0–10.5)
nRBC: 0 % (ref 0.0–0.2)

## 2022-07-05 LAB — TROPONIN I (HIGH SENSITIVITY)
Troponin I (High Sensitivity): 19 ng/L — ABNORMAL HIGH (ref <18)
Troponin I (High Sensitivity): 19 ng/L — ABNORMAL HIGH (ref <18)

## 2022-07-05 NOTE — ED Triage Notes (Signed)
Patient here POV from Home.  Endorses Mid/Left CP that is Non-Radiating and Constant since it began at 1230 Today. Some SOB.   Some Nausea. No Emesis. No Diarrhea. No Fevers. History of OSA without Use of CPAP.   NAD Noted during Triage. A&Ox4. GCS 15. Ambulatory.

## 2022-07-05 NOTE — ED Provider Notes (Signed)
[name] Provider Note   CSN: [id number] Arrival date & time: 07/05/22  1555     History  Chief Complaint  Patient presents with   Chest Pain    [name] is a 51 y.o. male.  Pt reports he began having discomfort in the left side of his chest at 12:30 today.  Pt went to Nurse at his job and was told that his blood pressure was elevated. Pt reports he did not sleep well last pm and did not feel well this am.  Pt denies cough. No congestion.  Pt denies fever or chills.  No shortness of breath.    The history is provided by the patient. No language interpreter was used.  Chest Pain Pain location:  L chest Pain quality: aching   Pain radiates to:  Does not radiate Pain severity:  Moderate Onset quality:  Gradual Duration:  5 hours Timing:  Constant Progression:  Worsening Chronicity:  New Relieved by:  Nothing Worsened by:  Nothing Risk factors: no high cholesterol        Home Medications Prior to Admission medications   Medication Sig Start Date End Date Taking? Authorizing Provider  ALPRAZolam Duanne Moron) 0.5 MG tablet Take 1-2 tablets 45 minutes before MRI, may take a 3rd if needed before going into scanner, MUST HAVE DRIVER 3/53/61   Suzzanne Cloud, NP  Butalbital-APAP-Caffeine [phone] MG capsule Take 1 capsule by mouth as needed. 03/09/21   [provider]  fluticasone (FLONASE) 50 MCG/ACT nasal spray Place 2 sprays into both nostrils as needed.     [provider]  Fremanezumab-vfrm (AJOVY) 225 MG/1.5ML SOAJ Inject 225 mg into the skin every 30 (thirty) days. 02/16/22   Suzzanne Cloud, NP  Loratadine (CLARITIN PO) Take 10 mg by mouth daily.    [provider]  Multiple Vitamins-Minerals (CENTRUM SILVER 50+MEN) TABS Take 1 tablet by mouth daily.    [provider]  ondansetron (ZOFRAN ODT) 4 MG disintegrating tablet Take 1 tablet (4 mg total) by mouth every 8 (eight) hours as needed for nausea or  vomiting. 05/13/21   Marcial Pacas, MD  Rimegepant Sulfate (NURTEC) 75 MG TBDP Take 75 mg by mouth as needed (Take 1 tablet at onset of headache, max is 75 mg in 24 hours). 02/16/22   Suzzanne Cloud, NP  Vitamin D, Ergocalciferol, (DRISDOL) 1.25 MG (50000 UNIT) CAPS capsule Take 1 capsule (50,000 Units total) by mouth every 7 (seven) days. 12/23/21   Dennard Nip D, MD  zolpidem (AMBIEN) 5 MG tablet Take 5 mg by mouth at bedtime as needed for sleep.    [provider]      Allergies    Sumatriptan    Review of Systems   Review of Systems  Cardiovascular:  Positive for chest pain.  All other systems reviewed and are negative.   Physical Exam Updated Vital Signs BP 122/76   Pulse (!) 57   Temp 97.9 F (36.6 C)   Resp (!) 22   Ht '6\' 5"'$  (1.956 m)   Wt (!) 158.8 kg   SpO2 99%   BMI 41.50 kg/m  Physical Exam Vitals and nursing note reviewed.  Constitutional:      General: He is not in acute distress.    Appearance: He is well-developed.  HENT:     Head: Normocephalic and atraumatic.  Eyes:     Conjunctiva/sclera: Conjunctivae normal.  Cardiovascular:     Rate and Rhythm: Normal rate and  regular rhythm.     Heart sounds: Normal heart sounds. No murmur heard. Pulmonary:     Effort: Pulmonary effort is normal. No respiratory distress.     Breath sounds: Normal breath sounds.  Abdominal:     Palpations: Abdomen is soft.     Tenderness: There is no abdominal tenderness.  Musculoskeletal:        General: No swelling. Normal range of motion.     Cervical back: Neck supple.  Skin:    General: Skin is warm and dry.     Capillary Refill: Capillary refill takes less than 2 seconds.  Neurological:     General: No focal deficit present.     Mental Status: He is alert.  Psychiatric:        Mood and Affect: Mood normal.     ED Results / Procedures / Treatments   Labs (all labs ordered are listed, but only abnormal results are displayed) Labs Reviewed  BASIC METABOLIC  PANEL - Abnormal; Notable for the following components:      Result Value   Glucose, Bld 109 (*)    All other components within normal limits  URINALYSIS, ROUTINE W REFLEX MICROSCOPIC - Abnormal; Notable for the following components:   Color, Urine COLORLESS (*)    All other components within normal limits  TROPONIN I (HIGH SENSITIVITY) - Abnormal; Notable for the following components:   Troponin I (High Sensitivity) 19 (*)    All other components within normal limits  TROPONIN I (HIGH SENSITIVITY) - Abnormal; Notable for the following components:   Troponin I (High Sensitivity) 19 (*)    All other components within normal limits  CBC    EKG None  Radiology DG Chest 2 View  Result Date: 07/05/2022 CLINICAL DATA:  Chest pain EXAM: CHEST - 2 VIEW COMPARISON:  01/22/2013 FINDINGS: Cardiomediastinal silhouette and pulmonary vasculature are within normal limits. Lungs are clear. IMPRESSION: No acute cardiopulmonary process. Electronically Signed   By: Miachel Roux M.D.   On: 07/05/2022 16:58    Procedures Procedures    Medications Ordered in ED Medications - No data to display  ED Course/ Medical Decision Making/ A&P                           Medical Decision Making Pt report he had discomfort in his chest today    Amount and/or Complexity of Data Reviewed External Data Reviewed: notes.    Details: Primary care notes reviewed Cardiology notes reviewed  Labs: ordered.    Details: Labs ordered reviewed and interpreted,  troponin is 19 x 2.   Radiology: ordered and independent interpretation performed. Decision-making details documented in ED Course.    Details: Chest xray  no acute cardiopulmonary process ECG/medicine tests: ordered and independent interpretation performed. Decision-making details documented in ED Course.    Details: No acute changes   Risk OTC drugs. Risk Details: Pt counseled on results.  Pt advised to follow up with Dr. Gaetano Hawthorne cardiology for evaluation.    Take a baby asa daily until further evaluation            Final Clinical Impression(s) / ED Diagnoses Final diagnoses:  Chest pain, unspecified type    Rx / DC Orders ED Discharge Orders     None     An After Visit Summary was printed and given to the patient.     Sidney Ace 07/05/22 2214    Tretha Sciara, MD  07/05/22 2247  

## 2022-07-05 NOTE — ED Notes (Signed)
Pt agreeable with d/c plan as discussed by provider- this nurse has verbally reinforced d/c instructions and provided pt with written copy - pt acknowledges verbal understanding and denies any additioinal questions concerns needs- pt ambulatory independently at d/c with steady gait; no vitals; no distress.

## 2022-07-06 ENCOUNTER — Telehealth: Payer: Self-pay | Admitting: Cardiovascular Disease

## 2022-07-06 NOTE — Telephone Encounter (Signed)
Pt came to [facility] ED yesterday 08/29 for chest pain and they ran test, but told him to call and set up appt to have more tests run. Pt would like to know if tests can go ahead and be ordered. Pt made appt with Laurann Montana, NP for 09/29. Please advise.

## 2022-07-12 NOTE — Progress Notes (Signed)
Cardiology Office Note:    Date:  07/13/2022   ID:  [name], DOB 1971/04/19, MRN [id number]  PCP:  Lawerance Cruel, MD   [facility] Cardiologist:  Skeet Latch, MD     Referring MD: Lawerance Cruel, MD   No chief complaint on file.   History of Present Illness:    [name] is a 51 y.o. male with a hx of OSA and Grave's disease here for follow up.  He was initially seen 10/2017 for the evaluation of shortness of breath and exertional chest pain.  At that appointment he also reported unintentional weight loss and night sweats.  He was found to be hyperthyroid.  His PCP started methimazole and we started metoprolol.  His chest pain was atypical but he was referred for ETT 10/2017 that was negative for ischemia.  He had a hypertensive response to exercise (229/41) and was noted to have frequent PACs and non-sustained atrial tachycardia.  He achieved 10.1 METS on a Bruce protocol. He had a coronary CTA 08/2018 that revealed normal coronary arteries and no CAD.     He was seen in the ED 07/05/2022 with recurrent chest pain. He saw the nurse at his job who said his blood pressure was elevated, but it was normal by the time he was at the ED. Cardiac enzymes were negative, and labs were otherwise unremarkable. There were no ischemic changes on EKG. They recommended he follow up with cardiology as an outpatient.  Today, he states that he has had better days. He has been diagnosed with PTSD. He also struggles with anxiety, depression, and fatigue. Every week for the past 2 years he has been seeing a psychiatrist. Recently he had an episode of sharp chest pains while at work. This initially began as his left arm started "feeling fatigued or funny" and then progressed to the chest pain. He tried to stretch it out, but the pain seemed to be worsening. At the time his blood pressure was 287 systolic. He does not usually monitor his BP at home. Yesterday he had  another episode of chest pain while driving home. It was sharp "like someone was stabbing me" and localized in his central chest. He has noticed some associated shortness of breath. Also he endorses chronic sleep apnea. For 2 months he had followed with a sleep specialist. Less than a month ago he tried using his CPAP but he was unable to tolerate this due to feeling claustrophobic. He has discussed pursuing the [facility] device, but he was not a candidate due to his high BMI. His formal exercise has been limited by chronic knee issues, and a recent torn muscle in his back. He has been trying to return to his usual exercise routines, including using the elliptical machine for 25 minutes. If he were to walk up an incline, he would feel fatigued but he would deny anginal symptoms. He occasionally needs to climb stairs at work. He reports his diet is decent. May have eggs for breakfast, with tomato or bacon. He didn't have anything for lunch today, snacked on white cheddar popcorn. Dinner may be beef and carrots, cole slaw. Breads are usually whole grain. Typically he aims for drinking 3-4 bottles of water throughout the day. He used to [employer], but he has become more mindful of eating smaller portions. He denies any palpitations, peripheral edema, lightheadedness, headaches, syncope, orthopnea, or PND.   Past Medical History:  Diagnosis Date   Arthritis  Atypical chest pain 10/24/2017   Atypical chest pain 10/24/2017   Back pain    Depression    GERD (gastroesophageal reflux disease)    occ   Graves disease 12/29/2017   Joint pain    Left knee pain    Migraine    Morbid obesity ([place]) 10/24/2017   Neck pain    Night sweats 10/24/2017   Numbness and tingling in left arm    Numbness and tingling in left hand    OSA (obstructive sleep apnea) 10/24/2017   Other fatigue    Polyuria 10/24/2017   PTSD (post-traumatic stress disorder)    Seasonal allergies    Sleep apnea    cpap 1 yr    Unintentional weight loss 10/24/2017    Past Surgical History:  Procedure Laterality Date   Epidural Steroid Injections     KNEE SURGERY Left    arthroscopy (Pt reports 3 left knee procedures) last one done on 09/16/2020)   SHOULDER ARTHROSCOPY WITH DEBRIDEMENT AND BICEP TENDON REPAIR     SHOULDER ARTHROSCOPY WITH SUBACROMIAL DECOMPRESSION, ROTATOR CUFF REPAIR AND BICEP TENDON REPAIR Right 04/09/2015   Procedure: RIGHT SHOULDER ARTHROSCOPY WITH SUBACROMIAL DECOMPRESSION, DISTAL  CLAVICLE RESECTION LABRAL AND ROTATOR CUFF DEBRIDEMENT;  Surgeon: Justice Britain, MD;  Location: [facility];  Service: Orthopedics;  Laterality: Right;   SHOULDER SURGERY Left    arthroscopy    Current Medications: Current Meds  Medication Sig   ALPRAZolam (XANAX) 0.5 MG tablet Take 1-2 tablets 45 minutes before MRI, may take a 3rd if needed before going into scanner, MUST HAVE DRIVER   Butalbital-APAP-Caffeine 50-325-40 MG capsule Take 1 capsule by mouth as needed.   celecoxib (CELEBREX) 200 MG capsule Take 200 mg by mouth as needed.   cyclobenzaprine (FLEXERIL) 10 MG tablet Take 10 mg by mouth as needed.   fluticasone (FLONASE) 50 MCG/ACT nasal spray Place 2 sprays into both nostrils as needed.    Fremanezumab-vfrm (AJOVY) 225 MG/1.5ML SOAJ Inject 225 mg into the skin every 30 (thirty) days.   Loratadine (CLARITIN PO) Take 10 mg by mouth daily.   metoprolol tartrate (LOPRESSOR) 25 MG tablet TAKE 1 TABLET 2 HOURS PRIOR TO CT   Multiple Vitamins-Minerals (CENTRUM SILVER 50+MEN) TABS Take 1 tablet by mouth daily.   ondansetron (ZOFRAN ODT) 4 MG disintegrating tablet Take 1 tablet (4 mg total) by mouth every 8 (eight) hours as needed for nausea or vomiting.   Rimegepant Sulfate (NURTEC) 75 MG TBDP Take 75 mg by mouth as needed (Take 1 tablet at onset of headache, max is 75 mg in 24 hours).   Vitamin D, Ergocalciferol, (DRISDOL) 1.25 MG (50000 UNIT) CAPS capsule Take 1 capsule (50,000 Units total) by mouth every 7 (seven)  days.   zolpidem (AMBIEN) 5 MG tablet Take 5 mg by mouth at bedtime as needed for sleep.     Allergies:   Sumatriptan   Social History   Socioeconomic History   Marital status: Married    Spouse name: Angie   Number of children: 2   Years of education: College    Highest education level: Not on file  Occupational History   Occupation: General Floor Help - [employer]   Occupation: TPP operator  Tobacco Use   Smoking status: Former    Packs/day: 0.25    Years: 5.00    Total pack years: 1.25    Types: Cigarettes    Quit date: 03/29/2005    Years since quitting: 17.3   Smokeless tobacco: Former  Vaping Use   Vaping Use: Never used  Substance and Sexual Activity   Alcohol use: Yes    Alcohol/week: 0.0 standard drinks of alcohol    Comment: Social use - rare   Drug use: No   Sexual activity: Yes    Partners: Female  Other Topics Concern   Not on file  Social History Narrative   Lives with wife and two children.   Right-handed.   Occasional use of caffeine- soda   Social Determinants of Health   Financial Resource Strain: Not on file  Food Insecurity: Not on file  Transportation Needs: Not on file  Physical Activity: Not on file  Stress: Not on file  Social Connections: Not on file     Family History: The patient's family history includes Alcoholism in his father and another family member; Cancer in his father and mother; Diabetes in his maternal grandmother; Drug abuse in his father and another family member; Heart attack in his maternal grandfather; Hypertension in his brother, father, mother, and sister; Obesity in his mother and another family member; Ovarian cancer in his mother; Stomach cancer in his father; Stroke in his maternal grandmother; Thyroid disease in his mother.  ROS:   Please see the history of present illness.    (+) Sharp chest pain (+) Anxiety (+) Depression (+) Fatigue All other systems reviewed and are negative.  EKGs/Labs/Other Studies  Reviewed:    The following studies were reviewed today:  Coronary CTA  08/09/2018: FINDINGS: A 120 kV prospective scan was triggered in the descending thoracic aorta at 111 HU's. Axial non-contrast 3 mm slices were carried out through the heart. The data set was analyzed on a dedicated work station and scored using the [place]. Gantry rotation speed was 250 msecs and collimation was .6 mm. No beta blockade and 0.8 mg of sl NTG was given. The 3D data set was reconstructed in 5% intervals of the 67-82 % of the R-R cycle. Diastolic phases were analyzed on a dedicated work station using MPR, MIP and VRT modes. The patient received 100 cc of contrast.   Aorta:  Normal size.  No calcifications.  No dissection.   Aortic Valve:  Trileaflet.  No calcifications.   Coronary Arteries:  Normal coronary origin.  Right dominance.   RCA is a large dominant artery that gives rise to PDA and PLVB. There is no plaque.   Left main is a large artery that gives rise to LAD and LCX arteries.   LAD is a large vessel that gives rise to a large D1 and has no plaque.   LCX is a non-dominant artery that gives rise to a small OM1 and large OM2 branch. There is no plaque.   RI is a normal size vessel that has no plaque.   Other findings:   Normal pulmonary vein drainage into the left atrium.   Normal let atrial appendage without a thrombus.   Normal size of the pulmonary artery.   Mild misregistration artifact noted.   IMPRESSION: 1. Coronary calcium score of 0. This was 0 percentile for age and sex matched control.   2. Normal coronary origin with right dominance.   3. No evidence of CAD.   ETT  11/02/2017: Addendum: Blood pressure demonstrated a hypertensive response to exercise. There was no ST segment deviation noted during stress. There was a hypertensive BP response to exercise up to 229/88mHg. The SBP dropped 1240mg at peak exericse although unclear if this was an erroneous  reading  as it was not rechecked. BP 1 minute into recovery was normal. The patient achieved 10.59mts and 106% MPHR. There were frequent PACs and nonsustained atrial tachycardia at peak exercise.   EKG:   EKG is personally reviewed. 07/13/2022: EKG was not ordered. 03/08/2021: Sinus rhythm.  Rate 60 bpm.  First-degree AV block. 10/03/17: Sinus rhythm.  Rate 82 bpm.  Recent Labs: 07/05/2022: BUN 18; Creatinine, Ser 1.14; Hemoglobin 14.0; Platelets 294; Potassium 3.9; Sodium 136   Recent Lipid Panel    Component Value Date/Time   CHOL 195 05/13/2021 0946   TRIG 67 05/13/2021 0946   HDL 60 05/13/2021 0946   LDLCALC 123 (H) 05/13/2021 0946     Risk Assessment/Calculations:           Physical Exam:    Wt Readings from Last 3 Encounters:  07/13/22 (!) 351 lb 3.2 oz (159.3 kg)  07/05/22 (!) 350 lb (158.8 kg)  02/16/22 (!) 324 lb (147 kg)     VS:  BP 118/78 (BP Location: Right Arm, Patient Position: Sitting, Cuff Size: Large)   Pulse 63   Ht '6\' 5"'$  (1.956 m)   Wt (!) 351 lb 3.2 oz (159.3 kg)   BMI 41.65 kg/m  , BMI Body mass index is 41.65 kg/m. GENERAL:  Well appearing HEENT: Pupils equal round and reactive, fundi not visualized, oral mucosa unremarkable NECK:  No jugular venous distention, waveform within normal limits, carotid upstroke brisk and symmetric, no bruits, no thyromegaly LUNGS:  Clear to auscultation bilaterally HEART:  RRR.  PMI not displaced or sustained,S1 and S2 within normal limits, no S3, no S4, no clicks, no rubs, no murmurs ABD:  Flat, positive bowel sounds normal in frequency in pitch, no bruits, no rebound, no guarding, no midline pulsatile mass, no hepatomegaly, no splenomegaly EXT:  2 plus pulses throughout, no edema, no cyanosis no clubbing SKIN:  No rashes no nodules NEURO:  Cranial nerves II through XII grossly intact, motor grossly intact throughout PSYCH:  Cognitively intact, oriented to person place and time   ASSESSMENT:    1. Atypical chest  pain   2. Obstructive apnea   3. Pre-procedure lab exam   4. Graves disease    PLAN:    Obstructive apnea He struggles with sleep apnea an dinsomnia. He cannot tolerate CPAP.  He is not a candidate for Inspire unless his BMI is <40.  Encouraged him to keep working with healthy weight and wellness and increase his exercise.  Graves disease Now stable.  Tachycardia has resolved and he no longer needs beta blockers.  Atypical chest pain His chest pain is very atypical.  It only occurs with rest and never with exertion.  He had a coronary CTA in 2019 that was negative.  He is not getting much exercise in order for [place] be reassured that his symptoms are not exertional.  We will get a repeat coronary CTA to assess for new evidence of coronary disease.  He is not a treadmill candidate given chronic knee pain.  If this is negative, would consider alternatives such as GERD.        Disposition: FU with Ikran Patman C. ROval Linsey MD, [facility].  Medication Adjustments/Labs and Tests Ordered: Current medicines are reviewed at length with the patient today.  Concerns regarding medicines are outlined above.   Orders Placed This Encounter  Procedures   CT CORONARY MORPH W/CTA COR W/SCORE W/CA W/CM &/OR WO/CM   Basic metabolic panel   Meds ordered this encounter  Medications   metoprolol tartrate (LOPRESSOR) 25 MG tablet    Sig: TAKE 1 TABLET 2 HOURS PRIOR TO CT    Dispense:  1 tablet    Refill:  0   Patient Instructions  Medication Instructions:  TAKE METOPROLOL 25 MG 1 TABLET 2 HOURS PRIOR TO CT  *If you need a refill on your cardiac medications before your next appointment, please call your pharmacy*  Lab Work: BMET TODAY   If you have labs (blood work) drawn today and your tests are completely normal, you will receive your results only by: MyChart Message (if you have MyChart) OR A paper copy in the mail If you have any lab test that is abnormal or we need to change your treatment,  we will call you to review the results.  Testing/Procedures: Your physician has requested that you have cardiac CT. Cardiac computed tomography (CT) is a painless test that uses an x-ray machine to take clear, detailed pictures of your heart. For further information please visit [facility]ugeFiesta[facility]tn. Please follow instruction sheet as given. THE OFFICE WILL CALL YOU TO SCHEDULE ONCE INSURANCE HAS BEEN REVIEWED   Follow-Up: AS NEEDED   Other Instructions    Your cardiac CT will be scheduled at one of the below locations:   [facility] [address] [place], [place] [address] [phone]  [place] [address] [place], [place] [address] [phone]  If scheduled at [facility], please arrive at the [facility] and Children's Entrance (Entrance C2) of [facility] 30 minutes prior to test start time. You can use the FREE valet parking offered at entrance C (encouraged to control the heart rate for the test)  Proceed to the [facility] Radiology Department (first floor) to check-in and test prep.  All radiology patients and guests should use entrance C2 at [facility], accessed from [facility], even though the hospital's physical address listed is [address].    If scheduled at [facility], please arrive 15 mins early for check-in and test prep.  Please follow these instructions carefully (unless otherwise directed):  Hold all erectile dysfunction medications at least 3 days (72 hrs) prior to test.  On the Night Before the Test: Be sure to Drink plenty of water. Do not consume any caffeinated/decaffeinated beverages or chocolate 12 hours prior to your test. Do not take any antihistamines 12 hours prior to your test.  On the Day of the Test: Drink plenty of water until 1 hour prior to the test. Do not eat any food 4 hours prior to the  test. You may take your regular medications prior to the test.  Take metoprolol (Lopressor) two hours prior to test. HOLD Furosemide/Hydrochlorothiazide morning of the test.      After the Test: Drink plenty of water. After receiving IV contrast, you may experience a mild flushed feeling. This is normal. On occasion, you may experience a mild rash up to 24 hours after the test. This is not dangerous. If this occurs, you can take Benadryl 25 mg and increase your fluid intake. If you experience trouble breathing, this can be serious. If it is severe call 911 IMMEDIATELY. If it is mild, please call our office. If you take any of these medications: Glipizide/Metformin, Avandament, Glucavance, please do not take 48 hours after completing test unless otherwise instructed.  We will call to schedule your test 2-4 weeks out understanding that some  insurance companies will need an authorization prior to the service being performed.   For non-scheduling related questions, please contact the cardiac imaging nurse navigator should you have any questions/concerns: Marchia Bond, Cardiac Imaging Nurse Navigator Gordy Clement, Cardiac Imaging Nurse Navigator [facility] Direct Office Dial: [phone]   For scheduling needs, including cancellations and rescheduling, please call [place], [phone].      I,Mathew Stumpf,acting as a Education administrator for Skeet Latch, MD.,have documented all relevant documentation on the behalf of Skeet Latch, MD,as directed by  Skeet Latch, MD while in the presence of Skeet Latch, MD.  I, [place] Oval Linsey, MD have reviewed all documentation for this visit.  The documentation of the exam, diagnosis, procedures, and orders on 07/13/2022 are all accurate and complete.   Signed, Skeet Latch, MD  07/13/2022 2:57 PM    [facility]

## 2022-07-13 ENCOUNTER — Encounter (HOSPITAL_BASED_OUTPATIENT_CLINIC_OR_DEPARTMENT_OTHER): Payer: Self-pay | Admitting: Cardiovascular Disease

## 2022-07-13 ENCOUNTER — Ambulatory Visit (HOSPITAL_BASED_OUTPATIENT_CLINIC_OR_DEPARTMENT_OTHER): Payer: 59 | Admitting: Cardiovascular Disease

## 2022-07-13 VITALS — BP 118/78 | HR 63 | Ht 77.0 in | Wt 351.2 lb

## 2022-07-13 DIAGNOSIS — E0500 Thyrotoxicosis with diffuse goiter without thyrotoxic crisis or storm: Secondary | ICD-10-CM

## 2022-07-13 DIAGNOSIS — G4733 Obstructive sleep apnea (adult) (pediatric): Secondary | ICD-10-CM | POA: Diagnosis not present

## 2022-07-13 DIAGNOSIS — Z01812 Encounter for preprocedural laboratory examination: Secondary | ICD-10-CM

## 2022-07-13 DIAGNOSIS — R0789 Other chest pain: Secondary | ICD-10-CM | POA: Diagnosis not present

## 2022-07-13 MED ORDER — METOPROLOL TARTRATE 25 MG PO TABS: ORAL_TABLET | ORAL | 0 refills | Status: DC

## 2022-07-13 NOTE — Assessment & Plan Note (Addendum)
He struggles with sleep apnea an dinsomnia. He cannot tolerate CPAP.  He is not a candidate for Inspire unless his BMI is <40.  Encouraged him to keep working with healthy weight and wellness and increase his exercise.

## 2022-07-13 NOTE — Assessment & Plan Note (Signed)
His chest pain is very atypical.  It only occurs with rest and never with exertion.  He had a coronary CTA in 2019 that was negative.  He is not getting much exercise in order for [place] to be reassured that his symptoms are not exertional.  We will get a repeat coronary CTA to assess for new evidence of coronary disease.  He is not a treadmill candidate given chronic knee pain.  If this is negative, would consider alternatives such as GERD.

## 2022-07-13 NOTE — Assessment & Plan Note (Signed)
Now stable.  Tachycardia has resolved and he no longer needs beta blockers.

## 2022-07-13 NOTE — Patient Instructions (Addendum)
Medication Instructions:  TAKE METOPROLOL 25 MG 1 TABLET 2 HOURS PRIOR TO CT  *If you need a refill on your cardiac medications before your next appointment, please call your pharmacy*  Lab Work: BMET TODAY   If you have labs (blood work) drawn today and your tests are completely normal, you will receive your results only by: [place] (if you have MyChart) OR A paper copy in the mail If you have any lab test that is abnormal or we need to change your treatment, we will call you to review the results.  Testing/Procedures: Your physician has requested that you have cardiac CT. Cardiac computed tomography (CT) is a painless test that uses an x-ray machine to take clear, detailed pictures of your heart. For further information please visit HugeFiesta.tn. Please follow instruction sheet as given. THE OFFICE WILL CALL YOU TO SCHEDULE ONCE INSURANCE HAS BEEN REVIEWED   Follow-Up: AS NEEDED   Other Instructions    Your cardiac CT will be scheduled at one of the below locations:   [facility] [address] [place], [place] [address] [phone]  [place] [address] [place], [place] [address] [phone]  If scheduled at [facility], please arrive at the [facility] and Children's Entrance (Entrance C2) of [facility] 30 minutes prior to test start time. You can use the FREE valet parking offered at entrance C (encouraged to control the heart rate for the test)  Proceed to the [facility] Radiology Department (first floor) to check-in and test prep.  All radiology patients and guests should use entrance C2 at [facility], accessed from [facility], even though the hospital's physical address listed is [address].    If scheduled at [facility], please arrive 15 mins early for check-in and test prep.  Please follow these  instructions carefully (unless otherwise directed):  Hold all erectile dysfunction medications at least 3 days (72 hrs) prior to test.  On the Night Before the Test: Be sure to Drink plenty of water. Do not consume any caffeinated/decaffeinated beverages or chocolate 12 hours prior to your test. Do not take any antihistamines 12 hours prior to your test.  On the Day of the Test: Drink plenty of water until 1 hour prior to the test. Do not eat any food 4 hours prior to the test. You may take your regular medications prior to the test.  Take metoprolol (Lopressor) two hours prior to test. HOLD Furosemide/Hydrochlorothiazide morning of the test.      After the Test: Drink plenty of water. After receiving IV contrast, you may experience a mild flushed feeling. This is normal. On occasion, you may experience a mild rash up to 24 hours after the test. This is not dangerous. If this occurs, you can take Benadryl 25 mg and increase your fluid intake. If you experience trouble breathing, this can be serious. If it is severe call 911 IMMEDIATELY. If it is mild, please call our office. If you take any of these medications: Glipizide/Metformin, Avandament, Glucavance, please do not take 48 hours after completing test unless otherwise instructed.  We will call to schedule your test 2-4 weeks out understanding that some insurance companies will need an authorization prior to the service being performed.   For non-scheduling related questions, please contact the cardiac imaging nurse navigator should you have any questions/concerns: Marchia Bond, Cardiac Imaging Nurse Navigator Gordy Clement, Cardiac Imaging  Nurse [facility] [facility] Direct Office Dial: [phone]   For scheduling needs, including cancellations and rescheduling, please call [place], [phone].

## 2022-07-14 LAB — BASIC METABOLIC PANEL
BUN/Creatinine Ratio: 16 (ref 9–20)
BUN: 16 mg/dL (ref 6–24)
CO2: 22 mmol/L (ref 20–29)
Calcium: 9.5 mg/dL (ref 8.7–10.2)
Chloride: 103 mmol/L (ref 96–106)
Creatinine, Ser: 1.02 mg/dL (ref 0.76–1.27)
Glucose: 90 mg/dL (ref 70–99)
Potassium: 4.1 mmol/L (ref 3.5–5.2)
Sodium: 137 mmol/L (ref 134–144)
eGFR: 88 mL/min/{1.73_m2} (ref >59)

## 2022-07-18 ENCOUNTER — Ambulatory Visit (INDEPENDENT_AMBULATORY_CARE_PROVIDER_SITE_OTHER): Payer: 59 | Admitting: Family Medicine

## 2022-07-18 ENCOUNTER — Encounter (INDEPENDENT_AMBULATORY_CARE_PROVIDER_SITE_OTHER): Payer: Self-pay | Admitting: Family Medicine

## 2022-07-18 VITALS — BP 124/72 | HR 64 | Temp 97.7°F | Ht 77.0 in | Wt 342.0 lb

## 2022-07-18 DIAGNOSIS — R7303 Prediabetes: Secondary | ICD-10-CM | POA: Diagnosis not present

## 2022-07-18 DIAGNOSIS — Z6839 Body mass index (BMI) 39.0-39.9, adult: Secondary | ICD-10-CM | POA: Insufficient documentation

## 2022-07-18 DIAGNOSIS — Z6841 Body Mass Index (BMI) 40.0 and over, adult: Secondary | ICD-10-CM | POA: Diagnosis not present

## 2022-07-18 DIAGNOSIS — E66812 Obesity, class 2: Secondary | ICD-10-CM | POA: Insufficient documentation

## 2022-07-18 DIAGNOSIS — E559 Vitamin D deficiency, unspecified: Secondary | ICD-10-CM | POA: Diagnosis not present

## 2022-07-18 DIAGNOSIS — E669 Obesity, unspecified: Secondary | ICD-10-CM

## 2022-07-18 MED ORDER — VITAMIN D (ERGOCALCIFEROL) 1.25 MG (50000 UNIT) PO CAPS: 50000.0000 [IU] | ORAL_CAPSULE | ORAL | 0 refills | Status: DC

## 2022-07-20 NOTE — Progress Notes (Deleted)
Patient: [name] Date of Birth: 02-25-1971  Reason for Visit: Follow up for migraines History from: Patient Primary Neurologist: Dr. Krista Blue   ASSESSMENT AND PLAN 51 y.o. year old male  1.  Chronic migraine headache, about 15 headache days a month -Add on Ajovy 225 mg monthly injection for migraine prevention -Try Nurtec 75 mg tablet for acute headache treatment -Has tried and failed nortriptyline, Effexor, metoprolol, Topamax, Maxalt, Imitrex, Fioricet -Check MRI of the brain with and without contrast due to continued frequent headache despite multiple medications, new onset nighttime headache last few weeks -Check creatinine today before MRI with contrast, will send in Xanax due to claustrophobia -Some of these headaches likely related to untreated OSA (see below)  2.  Obstructive sleep apnea -Not able to tolerate CPAP due to claustrophobia, seeing Dr. Maxwell Caul -Seeing CBT for chronic insomnia, on Ambien PRN -Reportedly not a candidate for INSPIRE due to BMI  No orders of the defined types were placed in this encounter.   HISTORY [name] is a 51 year old male, seen in request by his primary care physician Dr. Harrington Challenger, Dwyane Luo, for evaluation of migraine headaches, initial evaluation was August 12, 2019,   I reviewed and summarized the referring note. Chronic migraine OSA CPAP machine HTN Morbid obesity   He had a history of chronic migraine for many years, become more frequent in recent years, about 1-2 times every week, usually starting at the frontal region, pressure, move around to different spot, and headache he also has nausea, despite medications,  lasting about 4 to 5 hours, is usually associated with hungry, dehydration will trigger the headache   He has tried Excedrin Migraine with limited help,   This migraine started at age 82, he also complains of loud snoring, poor sleep quality, complains of depression, anxiety   UPDATE May 13 2021: He only takes low-dose nortriptyline 10 mg every night instead of titrating it up, he complains of frequent headaches, 1-3 times each week, use Fioricet as needed, getting prescription from Dr. Harrington Challenger,  He also complains of difficulty sleeping due to obesity, obstructive sleep apnea, had a history of PTSD, claustrophobia, difficult for him to compliant with CPAP machine, is under the care of Dr. Elenore Rota,  Update February 16, 2022. SS: Waking up around 12 AM with headache, feels in occipital area, left sided for last several weeks but none for last 2 night; gets same headache during day frontal, triggers with weather change, bright light, Fioricet doesn't really help. Was taking nortriptyline 50 mg QHS, not sure it helped? Was still having 15 headaches per month. has PTSD seeing therapist weekly. Seeing CBT for insomnia. Sees Dr. Maxwell Caul at [facility], can't use CPAP, trying to sleep from 11 PM-4 AM with sleep script.   Update July 21, 2022 SS:   1. Normal MRI of the brain  2. Mucosal thickening in the right ethmoid and maxillary sinuses    REVIEW OF SYSTEMS: Out of a complete 14 system review of symptoms, the patient complains only of the following symptoms, and all other reviewed systems are negative.  See HPI  ALLERGIES: Allergies  Allergen Reactions   Sumatriptan Other (See Comments)    HOME MEDICATIONS: Outpatient Medications Prior to Visit  Medication Sig Dispense Refill   ALPRAZolam (XANAX) 0.5 MG tablet Take 1-2 tablets 45 minutes before MRI, may take a 3rd if needed before going into scanner, MUST HAVE DRIVER 3 tablet 0   Butalbital-APAP-Caffeine 50-325-40 MG capsule Take 1 capsule  by mouth as needed.     celecoxib (CELEBREX) 200 MG capsule Take 200 mg by mouth as needed.     cyclobenzaprine (FLEXERIL) 10 MG tablet Take 10 mg by mouth as needed.     fluticasone (FLONASE) 50 MCG/ACT nasal spray Place 2 sprays into both nostrils as needed.      Fremanezumab-vfrm (AJOVY) 225  MG/1.5ML SOAJ Inject 225 mg into the skin every 30 (thirty) days. 1.68 mL 11   Loratadine (CLARITIN PO) Take 10 mg by mouth daily.     metoprolol tartrate (LOPRESSOR) 25 MG tablet TAKE 1 TABLET 2 HOURS PRIOR TO CT 1 tablet 0   Multiple Vitamins-Minerals (CENTRUM SILVER 50+MEN) TABS Take 1 tablet by mouth daily.     ondansetron (ZOFRAN ODT) 4 MG disintegrating tablet Take 1 tablet (4 mg total) by mouth every 8 (eight) hours as needed for nausea or vomiting. 20 tablet 6   Rimegepant Sulfate (NURTEC) 75 MG TBDP Take 75 mg by mouth as needed (Take 1 tablet at onset of headache, max is 75 mg in 24 hours). 8 tablet 11   Vitamin D, Ergocalciferol, (DRISDOL) 1.25 MG (50000 UNIT) CAPS capsule Take 1 capsule (50,000 Units total) by mouth every 7 (seven) days. 4 capsule 0   zolpidem (AMBIEN) 5 MG tablet Take 5 mg by mouth at bedtime as needed for sleep.     No facility-administered medications prior to visit.    PAST MEDICAL HISTORY: Past Medical History:  Diagnosis Date   Arthritis    Atypical chest pain 10/24/2017   Atypical chest pain 10/24/2017   Back pain    Depression    GERD (gastroesophageal reflux disease)    occ   Graves disease 12/29/2017   Joint pain    Left knee pain    Migraine    Morbid obesity ([place]) 10/24/2017   Neck pain    Night sweats 10/24/2017   Numbness and tingling in left arm    Numbness and tingling in left hand    OSA (obstructive sleep apnea) 10/24/2017   Other fatigue    Polyuria 10/24/2017   PTSD (post-traumatic stress disorder)    Seasonal allergies    Sleep apnea    cpap 1 yr   Unintentional weight loss 10/24/2017    PAST SURGICAL HISTORY: Past Surgical History:  Procedure Laterality Date   Epidural Steroid Injections     KNEE SURGERY Left    arthroscopy (Pt reports 3 left knee procedures) last one done on 09/16/2020)   SHOULDER ARTHROSCOPY WITH DEBRIDEMENT AND BICEP TENDON REPAIR     SHOULDER ARTHROSCOPY WITH SUBACROMIAL DECOMPRESSION, ROTATOR CUFF  REPAIR AND BICEP TENDON REPAIR Right 04/09/2015   Procedure: RIGHT SHOULDER ARTHROSCOPY WITH SUBACROMIAL DECOMPRESSION, DISTAL  CLAVICLE RESECTION LABRAL AND ROTATOR CUFF DEBRIDEMENT;  Surgeon: Justice Britain, MD;  Location: [facility];  Service: Orthopedics;  Laterality: Right;   SHOULDER SURGERY Left    arthroscopy    FAMILY HISTORY: Family History  Problem Relation Age of Onset   Obesity Mother    Hypertension Mother    Ovarian cancer Mother    Cancer Mother        ovarian   Thyroid disease Mother    Drug abuse Father    Alcoholism Father    Cancer Father    Hypertension Father    Stomach cancer Father    Hypertension Sister    Hypertension Brother    Stroke Maternal Grandmother    Diabetes Maternal Grandmother    Heart attack Maternal  Grandfather    Alcoholism Other    Drug abuse Other    Obesity Other     SOCIAL HISTORY: Social History   Socioeconomic History   Marital status: Married    Spouse name: Angie   Number of children: 2   Years of education: College    Highest education level: Not on file  Occupational History   Occupation: General Floor Help - [employer]   Occupation: [employer] engineer  Tobacco Use   Smoking status: Former    Packs/day: 0.25    Years: 5.00    Total pack years: 1.25    Types: Cigarettes    Quit date: 03/29/2005    Years since quitting: 17.3   Smokeless tobacco: Former  Scientific laboratory technician Use: Never used  Substance and Sexual Activity   Alcohol use: Yes    Alcohol/week: 0.0 standard drinks of alcohol    Comment: Social use - rare   Drug use: No   Sexual activity: Yes    Partners: Female  Other Topics Concern   Not on file  Social History Narrative   Lives with wife and two children.   Right-handed.   Occasional use of caffeine- soda   Social Determinants of Health   Financial Resource Strain: Not on file  Food Insecurity: Not on file  Transportation Needs: Not on file  Physical Activity: Not on file  Stress: Not on file   Social Connections: Not on file  Intimate Partner Violence: Not on file   PHYSICAL EXAM  There were no vitals filed for this visit.  There is no height or weight on file to calculate BMI.  Generalized: Well developed, in no acute distress  Neurological examination  Mentation: Alert oriented to time, place, history taking. Follows all commands speech and language fluent Cranial nerve II-XII: Pupils were equal round reactive to light. Extraocular movements were full, visual field were full on confrontational test. Facial sensation and strength were normal. Head turning and shoulder shrug were normal and symmetric. Motor: The motor testing reveals 5 over 5 strength of all 4 extremities. Good symmetric motor tone is noted throughout.  Sensory: Sensory testing is intact to soft touch on all 4 extremities. No evidence of extinction is noted.  Coordination: Cerebellar testing reveals good finger-nose-finger and heel-to-shin bilaterally.  Gait and station: Gait is normal.  Reflexes: Deep tendon reflexes are symmetric and normal bilaterally.   DIAGNOSTIC DATA (LABS, IMAGING, TESTING) - I reviewed patient records, labs, notes, testing and imaging myself where available.  Lab Results  Component Value Date   WBC 6.7 07/05/2022   HGB 14.0 07/05/2022   HCT 41.0 07/05/2022   MCV 86.9 07/05/2022   PLT 294 07/05/2022      Component Value Date/Time   NA 137 07/13/2022 1503   K 4.1 07/13/2022 1503   CL 103 07/13/2022 1503   CO2 22 07/13/2022 1503   GLUCOSE 90 07/13/2022 1503   GLUCOSE 109 (H) 07/05/2022 1607   BUN 16 07/13/2022 1503   CREATININE 1.02 07/13/2022 1503   CALCIUM 9.5 07/13/2022 1503   PROT 7.1 05/13/2021 0946   ALBUMIN 4.7 05/13/2021 0946   AST 21 05/13/2021 0946   ALT 15 05/13/2021 0946   ALKPHOS 59 05/13/2021 0946   BILITOT 0.3 05/13/2021 0946   GFRNONAA >60 07/05/2022 1607   GFRAA 99 08/02/2018 0837   Lab Results  Component Value Date   CHOL 195 05/13/2021   HDL 60  05/13/2021   LDLCALC 123 (H) 05/13/2021  TRIG 67 05/13/2021   Lab Results  Component Value Date   HGBA1C 5.9 (H) 05/13/2021   Lab Results  Component Value Date   VITAMINB12 393 05/13/2021   Lab Results  Component Value Date   TSH <0.006 (L) 10/24/2017    Butler Denmark, AGNP-C, DNP 07/20/2022, 10:58 AM [facility] [address], [place], [place] [address] [phone]

## 2022-07-21 ENCOUNTER — Ambulatory Visit: Payer: 59 | Admitting: Neurology

## 2022-07-21 ENCOUNTER — Encounter: Payer: Self-pay | Admitting: Neurology

## 2022-07-22 NOTE — Progress Notes (Unsigned)
Chief Complaint:   OBESITY [name] is here to discuss his progress with his obesity treatment plan along with follow-up of his obesity related diagnoses. [name] is on following a lower carbohydrate, vegetable and lean protein rich diet plan and states he is following his eating plan approximately 0% of the time. [name] states he is weight lifting for 60-90 minutes 3 times per week.  Today's visit was #: 7 Starting weight: 334 lbs Starting date: 05/13/2021 Today's weight: 342 lbs Today's date: 07/18/2022 Total lbs lost to date: 0 Total lbs lost since last in-office visit: 0  Interim History: [name] his last visit was 7 months ago.  He is ready to start working on weight loss.  He has had some chest pain recently and he is followed by Cardiology.  He is ready to get back on track.  Subjective:   1. Vitamin D deficiency [name] has been out of of his vitamin D for 6 months.  His level has likely worsened.  2. Pre-diabetes [name]'s recent A1c was 5.9, and he is not on metformin.  He had been on Jardiance recently.  Assessment/Plan:   1. Vitamin D deficiency [name] will continue prescription vitamin D 50,000 units once weekly, and we will refill for 1 month.  - Vitamin D, Ergocalciferol, (DRISDOL) 1.25 MG (50000 UNIT) CAPS capsule; Take 1 capsule (50,000 Units total) by mouth every 7 (seven) days.  Dispense: 4 capsule; Refill: 0  2. Pre-diabetes [name] will continue his medications, and will work on his diet and weight loss.  We will recheck labs in 1 month.  3. Obesity, Current BMI 40.6 [name] is currently in the action stage of change. As such, his goal is to get back to weightloss efforts . He has agreed to start the Category 3 Plan.   Exercise goals: As is.   Behavioral modification strategies: increasing lean protein intake.  [name] has agreed to follow-up with our clinic in 3 weeks. He was informed of the importance of  frequent follow-up visits to maximize his success with intensive lifestyle modifications for his multiple health conditions.   Objective:   Blood pressure 124/72, pulse 64, temperature 97.7 F (36.5 C), height '6\' 5"'$  (1.956 m), weight (!) 342 lb (155.1 kg), SpO2 97 %. Body mass index is 40.56 kg/m.  General: Cooperative, alert, well developed, in no acute distress. HEENT: Conjunctivae and lids unremarkable. Cardiovascular: Regular rhythm.  Lungs: Normal work of breathing. Neurologic: No focal deficits.   Lab Results  Component Value Date   CREATININE 1.02 07/13/2022   BUN 16 07/13/2022   NA 137 07/13/2022   K 4.1 07/13/2022   CL 103 07/13/2022   CO2 22 07/13/2022   Lab Results  Component Value Date   ALT 15 05/13/2021   AST 21 05/13/2021   ALKPHOS 59 05/13/2021   BILITOT 0.3 05/13/2021   Lab Results  Component Value Date   HGBA1C 5.9 (H) 05/13/2021   HGBA1C 5.8 (H) 10/24/2017   Lab Results  Component Value Date   INSULIN 15.6 05/13/2021   Lab Results  Component Value Date   TSH <0.006 (L) 10/24/2017   Lab Results  Component Value Date   CHOL 195 05/13/2021   HDL 60 05/13/2021   LDLCALC 123 (H) 05/13/2021   TRIG 67 05/13/2021   Lab Results  Component Value Date   VD25OH 32.6 11/25/2021   VD25OH 28.1 (L) 05/13/2021   Lab Results  Component Value Date   WBC 6.7 07/05/2022   HGB  14.0 07/05/2022   HCT 41.0 07/05/2022   MCV 86.9 07/05/2022   PLT 294 07/05/2022   No results found for: "IRON", "TIBC", "FERRITIN"  Attestation Statements:   Reviewed by clinician on day of visit: allergies, medications, problem list, medical history, surgical history, family history, social history, and previous encounter notes.   I, Trixie Dredge, am acting as transcriptionist for Dennard Nip, MD.  I have reviewed the above documentation for accuracy and completeness, and I agree with the above. -  Dennard Nip, MD

## 2022-07-27 ENCOUNTER — Telehealth: Payer: 59 | Admitting: Neurology

## 2022-07-27 NOTE — Progress Notes (Deleted)
Virtual Visit via Video Note  I connected with [name] on 07/27/22 at  3:30 PM EDT by a video enabled telemedicine application and verified that I am speaking with the correct person using two identifiers.  Location: Patient: *** Provider: ***   I discussed the limitations of evaluation and management by telemedicine and the availability of in person appointments. The patient expressed understanding and agreed to proceed.  History of Present Illness: [name] is a 51 year old male, seen in request by his primary care physician Dr. Harrington Challenger, Dwyane Luo, for evaluation of migraine headaches, initial evaluation was August 12, 2019,   I reviewed and summarized the referring note. Chronic migraine OSA CPAP machine HTN Morbid obesity   He had a history of chronic migraine for many years, become more frequent in recent years, about 1-2 times every week, usually starting at the frontal region, pressure, move around to different spot, and headache he also has nausea, despite medications,  lasting about 4 to 5 hours, is usually associated with hungry, dehydration will trigger the headache   He has tried Excedrin Migraine with limited help,   This migraine started at age 89, he also complains of loud snoring, poor sleep quality, complains of depression, anxiety   UPDATE May 13 2021: He only takes low-dose nortriptyline 10 mg every night instead of titrating it up, he complains of frequent headaches, 1-3 times each week, use Fioricet as needed, getting prescription from Dr. Harrington Challenger,  He also complains of difficulty sleeping due to obesity, obstructive sleep apnea, had a history of PTSD, claustrophobia, difficult for him to compliant with CPAP machine, is under the care of Dr. Elenore Rota,   Update February 16, 2022. SS: Waking up around 12 AM with headache, feels in occipital area, left sided for last several weeks but none for last 2 night; gets same headache during day frontal,  triggers with weather change, bright light, Fioricet doesn't really help. Was taking nortriptyline 50 mg QHS, not sure it helped? Was still having 15 headaches per month. has PTSD seeing therapist weekly. Seeing CBT for insomnia. Sees Dr. Maxwell Caul at [facility], can't use CPAP, trying to sleep from 11 PM-4 AM with sleep script.   Update July 27 2022 SS:    Observations/Objective:   Assessment and Plan:   Follow Up Instructions:    I discussed the assessment and treatment plan with the patient. The patient was provided an opportunity to ask questions and all were answered. The patient agreed with the plan and demonstrated an understanding of the instructions.   The patient was advised to call back or seek an in-person evaluation if the symptoms worsen or if the condition fails to improve as anticipated.  I provided *** minutes of non-face-to-face time during this encounter.   Suzzanne Cloud, NP

## 2022-07-29 ENCOUNTER — Telehealth (HOSPITAL_COMMUNITY): Payer: Self-pay | Admitting: Emergency Medicine

## 2022-07-29 NOTE — Telephone Encounter (Signed)
Reaching out to patient to offer assistance regarding upcoming cardiac imaging study; pt verbalizes understanding of appt date/time, parking situation and where to check in, pre-test NPO status and medications ordered, and verified current allergies; name and call back number provided for further questions should they arise Marchia Bond RN Navigator Cardiac Imaging [facility] [phone] office [phone] cell  Arrival 930 w/c entrance Denies iv issues '25mg'$  metoprolol

## 2022-08-01 ENCOUNTER — Ambulatory Visit (HOSPITAL_COMMUNITY): Payer: 59

## 2022-08-05 ENCOUNTER — Ambulatory Visit (HOSPITAL_BASED_OUTPATIENT_CLINIC_OR_DEPARTMENT_OTHER): Payer: 59 | Admitting: Family

## 2022-08-17 ENCOUNTER — Telehealth (HOSPITAL_BASED_OUTPATIENT_CLINIC_OR_DEPARTMENT_OTHER): Payer: Self-pay | Admitting: Cardiovascular Disease

## 2022-08-17 ENCOUNTER — Telehealth (HOSPITAL_COMMUNITY): Payer: Self-pay | Admitting: *Deleted

## 2022-08-17 MED ORDER — METOPROLOL TARTRATE 25 MG PO TABS: ORAL_TABLET | ORAL | 0 refills | Status: DC

## 2022-08-17 NOTE — Telephone Encounter (Signed)
Reaching out to patient to offer assistance regarding upcoming cardiac imaging study; pt verbalizes understanding of appt date/time, parking situation and where to check in, medications ordered, and verified current allergies; name and call back number provided for further questions should they arise  Brian Clement RN Navigator Cardiac Imaging [facility] [phone] office [phone] cell  Patient to take '25mg'$  metoprolol tartrate two hours prior to his cardiac CT scan. He is aware to arrive at 8:30am.

## 2022-08-17 NOTE — Telephone Encounter (Signed)
Patient is calling regarding the medication he is supposed to take prior to his CT tomorrow. He states he called his pharmacy and they advised they have no record of a medication being sent in. Please advise.

## 2022-08-18 ENCOUNTER — Ambulatory Visit (INDEPENDENT_AMBULATORY_CARE_PROVIDER_SITE_OTHER): Payer: 59 | Admitting: Family Medicine

## 2022-08-18 ENCOUNTER — Ambulatory Visit (HOSPITAL_COMMUNITY)
Admission: RE | Admit: 2022-08-18 | Discharge: 2022-08-18 | Disposition: A | Discharge status: Home / Self Care | Payer: 59 | Source: Ambulatory Visit | Attending: Cardiovascular Disease | Admitting: Cardiovascular Disease

## 2022-08-18 DIAGNOSIS — R0789 Other chest pain: Secondary | ICD-10-CM | POA: Diagnosis present

## 2022-08-18 MED ORDER — IOHEXOL 350 MG/ML SOLN
100.0000 mL | Freq: Once | INTRAVENOUS | Status: AC | PRN
  Administered 2022-08-18: 100 mL via INTRAVENOUS

## 2022-08-18 MED ORDER — NITROGLYCERIN 0.4 MG SL SUBL
0.8000 mg | SUBLINGUAL_TABLET | Freq: Once | SUBLINGUAL | Status: AC
  Administered 2022-08-18: 0.8 mg via SUBLINGUAL

## 2022-08-18 MED ORDER — NITROGLYCERIN 0.4 MG SL SUBL
SUBLINGUAL_TABLET | SUBLINGUAL | Status: AC
  Filled 2022-08-18: qty 2

## 2022-09-01 ENCOUNTER — Encounter (INDEPENDENT_AMBULATORY_CARE_PROVIDER_SITE_OTHER): Payer: Self-pay | Admitting: Family Medicine

## 2022-09-01 ENCOUNTER — Ambulatory Visit (INDEPENDENT_AMBULATORY_CARE_PROVIDER_SITE_OTHER): Payer: 59 | Admitting: Family Medicine

## 2022-09-01 VITALS — BP 122/72 | HR 80 | Temp 98.3°F | Ht 77.0 in | Wt 342.0 lb

## 2022-09-01 DIAGNOSIS — E669 Obesity, unspecified: Secondary | ICD-10-CM | POA: Diagnosis not present

## 2022-09-01 DIAGNOSIS — E559 Vitamin D deficiency, unspecified: Secondary | ICD-10-CM

## 2022-09-01 DIAGNOSIS — Z6841 Body Mass Index (BMI) 40.0 and over, adult: Secondary | ICD-10-CM | POA: Diagnosis not present

## 2022-09-01 DIAGNOSIS — M170 Bilateral primary osteoarthritis of knee: Secondary | ICD-10-CM | POA: Diagnosis not present

## 2022-09-01 MED ORDER — VITAMIN D (ERGOCALCIFEROL) 1.25 MG (50000 UNIT) PO CAPS: 50000.0000 [IU] | ORAL_CAPSULE | ORAL | 0 refills | Status: DC

## 2022-09-06 NOTE — Progress Notes (Signed)
Chief Complaint:   OBESITY [name] is here to discuss his progress with his obesity treatment plan along with follow-up of his obesity related diagnoses. [name] is on the Category 3 Plan and states he is following his eating plan approximately 0% of the time. [name] states he is walking for 20-25 minutes 7 times per week.  Today's visit was #: 8 Starting weight: 334 lbs Starting date: 05/13/2021 Today's weight: 342 lbs Today's date: 09/01/2022 Total lbs lost to date: 0 Total lbs lost since last in-office visit: 0  Interim History: [name] has maintained his weight loss.  He was very ill with COVID in September and was very sick and fatigued.  He has recovered now and he is getting back on track after his recent vacation in [place].   Subjective:   1. Vitamin D deficiency [name] is taking vitamin D 50,000 IU once weekly with no side effects noted.  2. Osteoarthritis of both knees, unspecified osteoarthritis type [name] is working with orthopedic and he is taking Celebrex without side effects.  He wants to start some jogging to his tolerance.  Assessment/Plan:   1. Vitamin D deficiency [name] will continue prescription vitamin D 50,000 IU once weekly, and we will refill for 1 month.  - Vitamin D, Ergocalciferol, (DRISDOL) 1.25 MG (50000 UNIT) CAPS capsule; Take 1 capsule (50,000 Units total) by mouth every 7 (seven) days.  Dispense: 4 capsule; Refill: 0  2. Osteoarthritis of both knees, unspecified osteoarthritis type [name] will continue Celebrex, and he will continue with his healthy eating plan and exercise.  3. Obesity, Current BMI 40.6 [name] is currently in the action stage of change. As such, his goal is to continue with weight loss efforts. He has agreed to following a lower carbohydrate, vegetable and lean protein rich diet plan.   Exercise goals: As is.   Behavioral modification strategies: increasing lean protein intake,  decreasing simple carbohydrates, meal planning and cooking strategies, better snacking choices, holiday eating strategies , and planning for success.  [name] has agreed to follow-up with our clinic in 4 weeks. He was informed of the importance of frequent follow-up visits to maximize his success with intensive lifestyle modifications for his multiple health conditions.   Objective:   Blood pressure 122/72, pulse 80, temperature 98.3 F (36.8 C), height '6\' 5"'$  (1.956 m), weight (!) 342 lb (155.1 kg), SpO2 97 %. Body mass index is 40.56 kg/m.  General: Cooperative, alert, well developed, in no acute distress. HEENT: Conjunctivae and lids unremarkable. Cardiovascular: Regular rhythm.  Lungs: Normal work of breathing. Neurologic: No focal deficits.   Lab Results  Component Value Date   CREATININE 1.02 07/13/2022   BUN 16 07/13/2022   NA 137 07/13/2022   K 4.1 07/13/2022   CL 103 07/13/2022   CO2 22 07/13/2022   Lab Results  Component Value Date   ALT 15 05/13/2021   AST 21 05/13/2021   ALKPHOS 59 05/13/2021   BILITOT 0.3 05/13/2021   Lab Results  Component Value Date   HGBA1C 5.9 (H) 05/13/2021   HGBA1C 5.8 (H) 10/24/2017   Lab Results  Component Value Date   INSULIN 15.6 05/13/2021   Lab Results  Component Value Date   TSH <0.006 (L) 10/24/2017   Lab Results  Component Value Date   CHOL 195 05/13/2021   HDL 60 05/13/2021   LDLCALC 123 (H) 05/13/2021   TRIG 67 05/13/2021   Lab Results  Component Value Date   VD25OH 32.6 11/25/2021  VD25OH 28.1 (L) 05/13/2021   Lab Results  Component Value Date   WBC 6.7 07/05/2022   HGB 14.0 07/05/2022   HCT 41.0 07/05/2022   MCV 86.9 07/05/2022   PLT 294 07/05/2022   No results found for: "IRON", "TIBC", "FERRITIN"  Attestation Statements:   Reviewed by clinician on day of visit: allergies, medications, problem list, medical history, surgical history, family history, social history, and previous encounter  notes.   I, Trixie Dredge, am acting as transcriptionist for Dennard Nip, MD.  I have reviewed the above documentation for accuracy and completeness, and I agree with the above. -  Dennard Nip, MD  I have personally spent 42 minutes total time today in preparation, patient care, and documentation for this visit, including the following: review of clinical lab tests; review of medical tests/procedures/services.

## 2022-10-06 ENCOUNTER — Ambulatory Visit (INDEPENDENT_AMBULATORY_CARE_PROVIDER_SITE_OTHER): Payer: 59 | Admitting: Family Medicine

## 2022-10-06 ENCOUNTER — Encounter (INDEPENDENT_AMBULATORY_CARE_PROVIDER_SITE_OTHER): Payer: Self-pay | Admitting: Family Medicine

## 2022-10-06 VITALS — BP 115/66 | HR 73 | Temp 97.6°F | Ht 77.0 in | Wt 335.0 lb

## 2022-10-06 DIAGNOSIS — R7303 Prediabetes: Secondary | ICD-10-CM

## 2022-10-06 DIAGNOSIS — E559 Vitamin D deficiency, unspecified: Secondary | ICD-10-CM

## 2022-10-06 DIAGNOSIS — E7800 Pure hypercholesterolemia, unspecified: Secondary | ICD-10-CM | POA: Insufficient documentation

## 2022-10-06 DIAGNOSIS — E7849 Other hyperlipidemia: Secondary | ICD-10-CM

## 2022-10-06 DIAGNOSIS — E669 Obesity, unspecified: Secondary | ICD-10-CM | POA: Diagnosis not present

## 2022-10-06 DIAGNOSIS — Z6839 Body mass index (BMI) 39.0-39.9, adult: Secondary | ICD-10-CM

## 2022-10-06 MED ORDER — VITAMIN D (ERGOCALCIFEROL) 1.25 MG (50000 UNIT) PO CAPS: 50000.0000 [IU] | ORAL_CAPSULE | ORAL | 0 refills | Status: DC

## 2022-10-07 LAB — LIPID PANEL WITH LDL/HDL RATIO
Cholesterol, Total: 198 mg/dL (ref 100–199)
HDL: 53 mg/dL (ref >39)
LDL Chol Calc (NIH): 134 mg/dL — ABNORMAL HIGH (ref 0–99)
LDL/HDL Ratio: 2.5 ratio (ref 0.0–3.6)
Triglycerides: 61 mg/dL (ref 0–149)
VLDL Cholesterol Cal: 11 mg/dL (ref 5–40)

## 2022-10-07 LAB — CMP14+EGFR
ALT: 18 IU/L (ref 0–44)
AST: 25 IU/L (ref 0–40)
Albumin/Globulin Ratio: 1.9 (ref 1.2–2.2)
Albumin: 4.7 g/dL (ref 3.8–4.9)
Alkaline Phosphatase: 52 IU/L (ref 44–121)
BUN/Creatinine Ratio: 13 (ref 9–20)
BUN: 13 mg/dL (ref 6–24)
Bilirubin Total: 0.5 mg/dL (ref 0.0–1.2)
CO2: 20 mmol/L (ref 20–29)
Calcium: 9.9 mg/dL (ref 8.7–10.2)
Chloride: 103 mmol/L (ref 96–106)
Creatinine, Ser: 1.04 mg/dL (ref 0.76–1.27)
Globulin, Total: 2.5 g/dL (ref 1.5–4.5)
Glucose: 81 mg/dL (ref 70–99)
Potassium: 4.3 mmol/L (ref 3.5–5.2)
Sodium: 140 mmol/L (ref 134–144)
Total Protein: 7.2 g/dL (ref 6.0–8.5)
eGFR: 86 mL/min/{1.73_m2} (ref >59)

## 2022-10-07 LAB — INSULIN, RANDOM: INSULIN: 9.9 u[IU]/mL (ref 2.6–24.9)

## 2022-10-07 LAB — HEMOGLOBIN A1C
Est. average glucose Bld gHb Est-mCnc: 114 mg/dL
Hgb A1c MFr Bld: 5.6 % (ref 4.8–5.6)

## 2022-10-07 LAB — VITAMIN D 25 HYDROXY (VIT D DEFICIENCY, FRACTURES): Vit D, 25-Hydroxy: 26.3 ng/mL — ABNORMAL LOW (ref 30.0–100.0)

## 2022-10-17 NOTE — Progress Notes (Unsigned)
Chief Complaint:   OBESITY [name] is here to discuss his progress with his obesity treatment plan along with follow-up of his obesity related diagnoses. [name] is on following a lower carbohydrate, vegetable and lean protein rich diet plan and states he is following his eating plan approximately 40-50% of the time. [name] states he is weight lifting for 60-90 minutes 3-4 times per week.  Today's visit was #: 9 Starting weight: 334 lbs Starting date: 05/13/2021 Today's weight: 335 lbs Today's date: 10/06/2022 Total lbs lost to date: 0 Total lbs lost since last in-office visit: 7  Interim History: [name] has done well with weight loss over Thanksgiving. He is drinking a lemon, honey, cinnamon, and tumeric tea that he read about on the internet and makes it himself. He has been doing a low carbohydrates plan otherwise approximately 50% of the time, but he I also doing it on his own. He has significant increase in exercise.   Subjective:   1. Vitamin D deficiency [name] is on Vitamin D, and he is due for labs. His last level was not yet at goal.   2. Pre-diabetes [name] is working on his weight loss and he is due for labs.   3. Hyperlipidemia, pure [name] last LDL had been above goal. He is working on his diet and weight loss.   Assessment/Plan:   1. Vitamin D deficiency We will check labs today, and we will refill prescription Vitamin D for 1 month. [name] will follow-up for routine testing of Vitamin D, at least 2-3 times per year to avoid over-replacement.  - Vitamin D, Ergocalciferol, (DRISDOL) 1.25 MG (50000 UNIT) CAPS capsule; Take 1 capsule (50,000 Units total) by mouth every 7 (seven) days.  Dispense: 4 capsule; Refill: 0 - VITAMIN D 25 Hydroxy (Vit-D Deficiency, Fractures)  2. Pre-diabetes We will check labs today. [name] will continue to work on weight loss, exercise, and decreasing simple carbohydrates to help decrease the  risk of diabetes.   - Insulin, random - Hemoglobin A1c - CMP14+EGFR  3. Hyperlipidemia, pure We will check labs. Cardiovascular risk and specific lipid/LDL goals reviewed. [name] will continue to work on diet, exercise and weight loss efforts. Orders and follow up as documented in patient record.   - Lipid Panel With LDL/HDL Ratio  4. Obesity, Current BMI 39.8 [name] is currently in the action stage of change. As such, his goal is to continue with weight loss efforts. He has agreed to following a lower carbohydrate, vegetable and lean protein rich diet plan.   Exercise goals: As is.   Behavioral modification strategies: increasing lean protein intake, decreasing simple carbohydrates, meal planning and cooking strategies, and holiday eating strategies .  [name] has agreed to follow-up with our clinic in 4 weeks. He was informed of the importance of frequent follow-up visits to maximize his success with intensive lifestyle modifications for his multiple health conditions.   [name] was informed we would discuss his lab results at his next visit unless there is a critical issue that needs to be addressed sooner. [name] agreed to keep his next visit at the agreed upon time to discuss these results.  Objective:   Blood pressure 115/66, pulse 73, temperature 97.6 F (36.4 C), height _0  (1.956 m), weight (!) 335 lb (152 kg), SpO2 92 %. Body mass index is 39.73 kg/m.  General: Cooperative, alert, well developed, in no acute distress. HEENT: Conjunctivae and lids unremarkable. Cardiovascular: Regular rhythm.  Lungs: Normal work of breathing. Neurologic: No  focal deficits.   Lab Results  Component Value Date   CREATININE 1.04 10/06/2022   BUN 13 10/06/2022   NA 140 10/06/2022   K 4.3 10/06/2022   CL 103 10/06/2022   CO2 20 10/06/2022   Lab Results  Component Value Date   ALT 18 10/06/2022   AST 25 10/06/2022   ALKPHOS 52 10/06/2022   BILITOT 0.5  10/06/2022   Lab Results  Component Value Date   HGBA1C 5.6 10/06/2022   HGBA1C 5.9 (H) 05/13/2021   HGBA1C 5.8 (H) 10/24/2017   Lab Results  Component Value Date   INSULIN 9.9 10/06/2022   INSULIN 15.6 05/13/2021   Lab Results  Component Value Date   TSH <0.006 (L) 10/24/2017   Lab Results  Component Value Date   CHOL 198 10/06/2022   HDL 53 10/06/2022   LDLCALC 134 (H) 10/06/2022   TRIG 61 10/06/2022   Lab Results  Component Value Date   VD25OH 26.3 (L) 10/06/2022   VD25OH 32.6 11/25/2021   VD25OH 28.1 (L) 05/13/2021   Lab Results  Component Value Date   WBC 6.7 07/05/2022   HGB 14.0 07/05/2022   HCT 41.0 07/05/2022   MCV 86.9 07/05/2022   PLT 294 07/05/2022   No results found for: "IRON", "TIBC", "FERRITIN"  Attestation Statements:   Reviewed by clinician on day of visit: allergies, medications, problem list, medical history, surgical history, family history, social history, and previous encounter notes.   I, [name], am acting as transcriptionist for Brian Nip, MD.  I have reviewed the above documentation for accuracy and completeness, and I agree with the above. -  Brian Nip, MD

## 2022-11-17 ENCOUNTER — Ambulatory Visit (INDEPENDENT_AMBULATORY_CARE_PROVIDER_SITE_OTHER): Payer: 59 | Admitting: Family Medicine

## 2022-12-01 ENCOUNTER — Ambulatory Visit (INDEPENDENT_AMBULATORY_CARE_PROVIDER_SITE_OTHER): Payer: 59 | Admitting: Family Medicine

## 2022-12-29 ENCOUNTER — Ambulatory Visit (INDEPENDENT_AMBULATORY_CARE_PROVIDER_SITE_OTHER): Payer: 59 | Admitting: Family Medicine

## 2023-01-19 ENCOUNTER — Ambulatory Visit: Payer: 59 | Admitting: Podiatry

## 2023-01-19 ENCOUNTER — Encounter: Payer: Self-pay | Admitting: Podiatry

## 2023-01-19 DIAGNOSIS — R519 Headache, unspecified: Secondary | ICD-10-CM | POA: Insufficient documentation

## 2023-01-19 DIAGNOSIS — J069 Acute upper respiratory infection, unspecified: Secondary | ICD-10-CM | POA: Insufficient documentation

## 2023-01-19 DIAGNOSIS — M545 Low back pain, unspecified: Secondary | ICD-10-CM | POA: Insufficient documentation

## 2023-01-19 DIAGNOSIS — G4700 Insomnia, unspecified: Secondary | ICD-10-CM | POA: Insufficient documentation

## 2023-01-19 DIAGNOSIS — F4312 Post-traumatic stress disorder, chronic: Secondary | ICD-10-CM | POA: Insufficient documentation

## 2023-01-19 DIAGNOSIS — G43909 Migraine, unspecified, not intractable, without status migrainosus: Secondary | ICD-10-CM | POA: Insufficient documentation

## 2023-01-19 DIAGNOSIS — L600 Ingrowing nail: Secondary | ICD-10-CM

## 2023-01-19 DIAGNOSIS — J0110 Acute frontal sinusitis, unspecified: Secondary | ICD-10-CM | POA: Insufficient documentation

## 2023-01-19 DIAGNOSIS — G43809 Other migraine, not intractable, without status migrainosus: Secondary | ICD-10-CM | POA: Insufficient documentation

## 2023-01-19 DIAGNOSIS — Z09 Encounter for follow-up examination after completed treatment for conditions other than malignant neoplasm: Secondary | ICD-10-CM | POA: Insufficient documentation

## 2023-01-19 DIAGNOSIS — M79675 Pain in left toe(s): Secondary | ICD-10-CM | POA: Insufficient documentation

## 2023-01-19 DIAGNOSIS — G43719 Chronic migraine without aura, intractable, without status migrainosus: Secondary | ICD-10-CM | POA: Insufficient documentation

## 2023-01-19 DIAGNOSIS — L0390 Cellulitis, unspecified: Secondary | ICD-10-CM | POA: Insufficient documentation

## 2023-01-19 DIAGNOSIS — J329 Chronic sinusitis, unspecified: Secondary | ICD-10-CM | POA: Insufficient documentation

## 2023-01-19 NOTE — Progress Notes (Signed)
  Subjective:  Patient ID: [name], male    DOB: June 06, 1971,  MRN: [id number]  Chief Complaint  Patient presents with   Toe Pain    Diagnosis: Toe pain. Possible ingrown Referring Provider: Lawerance Cruel    52 y.o. male presents with the above complaint. History confirmed with patient.  Has chronic ingrown toenail that pedicurist often trims out but it is usually still painful.  Same issue has returned again, he has a golf trip upcoming this weekend  Objective:  Physical Exam: warm, good capillary refill, no trophic changes or ulcerative lesions, normal DP and PT pulses, and normal sensory exam. Left Foot: Medial border ingrowing no paronychia noted Assessment:   1. Ingrowing left great toenail      Plan:  Patient was evaluated and treated and all questions answered.  We again discussed partial permanent matricectomy which I recommended.  He has an upcoming trip this week and so would not like to do this currently.  I recommended slant back debridement and utilizing a sharp nail nipper and nail curette this was completed, did require local anesthetic with lidocaine and Marcaine and a digital block which he tolerated well.  Post care instructions given.  I will see him back when he is ready for partial permanent matricectomy  Return if symptoms worsen or fail to improve.

## 2023-01-19 NOTE — Patient Instructions (Signed)
Soak Instructions    THE DAY AFTER THE PROCEDURE  Place 1/4 cup of epsom salts (or betadine, or white vinegar) in a quart of warm tap water.  Submerge your foot or feet  continue to soak in the solution for 20 minutes.  Do twice a day as needed. Can put lotion or neosporin in the corner there to keep the nail and skin softer  IF YOUR SKIN BECOMES IRRITATED WHILE USING THESE INSTRUCTIONS, IT IS OKAY TO SWITCH TO  WHITE VINEGAR AND WATER. Or you may use antibacterial soap and water to keep the toe clean  Monitor for any signs/symptoms of infection. Call the office immediately if any occur or go directly to the emergency room. Call with any questions/concerns.

## 2023-01-23 DIAGNOSIS — Z0289 Encounter for other administrative examinations: Secondary | ICD-10-CM

## 2023-01-25 ENCOUNTER — Telehealth: Payer: Self-pay | Admitting: Neurology

## 2023-01-25 NOTE — Progress Notes (Unsigned)
Patient: [name] Date of Birth: 1970-11-29  Reason for Visit: Follow up for migraines History from: Patient Primary Neurologist: Dr. Krista Blue   ASSESSMENT AND PLAN 52 y.o. year old male  1.  Chronic migraine headache, about 15 headache days a month -Add on Ajovy 225 mg monthly injection for migraine prevention -Try Nurtec 75 mg tablet for acute headache treatment -Has tried and failed nortriptyline, Effexor, metoprolol, Topamax, Maxalt, Imitrex, Fioricet -Check MRI of the brain with and without contrast due to continued frequent headache despite multiple medications, new onset nighttime headache last few weeks -Check creatinine today before MRI with contrast, will send in Xanax due to claustrophobia -Some of these headaches likely related to untreated OSA (see below)  2.  Obstructive sleep apnea -Not able to tolerate CPAP due to claustrophobia, seeing Dr. Maxwell Caul -Seeing CBT for chronic insomnia, on Ambien PRN -Reportedly not a candidate for INSPIRE due to BMI  No orders of the defined types were placed in this encounter.   HISTORY [name] is a 52 year old male, seen in request by his primary care physician Dr. Harrington Challenger, Dwyane Luo, for evaluation of migraine headaches, initial evaluation was August 12, 2019,   I reviewed and summarized the referring note. Chronic migraine OSA CPAP machine HTN Morbid obesity   He had a history of chronic migraine for many years, become more frequent in recent years, about 1-2 times every week, usually starting at the frontal region, pressure, move around to different spot, and headache he also has nausea, despite medications,  lasting about 4 to 5 hours, is usually associated with hungry, dehydration will trigger the headache   He has tried Excedrin Migraine with limited help,   This migraine started at age 31, he also complains of loud snoring, poor sleep quality, complains of depression, anxiety   UPDATE May 13 2021: He only takes low-dose nortriptyline 10 mg every night instead of titrating it up, he complains of frequent headaches, 1-3 times each week, use Fioricet as needed, getting prescription from Dr. Harrington Challenger,  He also complains of difficulty sleeping due to obesity, obstructive sleep apnea, had a history of PTSD, claustrophobia, difficult for him to compliant with CPAP machine, is under the care of Dr. Elenore Rota,  Update February 16, 2022. SS: Waking up around 12 AM with headache, feels in occipital area, left sided for last several weeks but none for last 2 night; gets same headache during day frontal, triggers with weather change, bright light, Fioricet doesn't really help. Was taking nortriptyline 50 mg QHS, not sure it helped? Was still having 15 headaches per month. has PTSD seeing therapist weekly. Seeing CBT for insomnia. Sees Dr. Maxwell Caul at [facility], can't use CPAP, trying to sleep from 11 PM-4 AM with sleep script.   Update January 26, 2023 SS: MRI of the brain with and without contrast was normal.  REVIEW OF SYSTEMS: Out of a complete 14 system review of symptoms, the patient complains only of the following symptoms, and all other reviewed systems are negative.  See HPI  ALLERGIES: Allergies  Allergen Reactions   Sumatriptan Other (See Comments)    HOME MEDICATIONS: Outpatient Medications Prior to Visit  Medication Sig Dispense Refill   ALPRAZolam (XANAX) 0.5 MG tablet Take 1-2 tablets 45 minutes before MRI, may take a 3rd if needed before going into scanner, MUST HAVE DRIVER 3 tablet 0   Butalbital-APAP-Caffeine 50-325-40 MG capsule Take 1 capsule by mouth as needed.     empagliflozin (JARDIANCE) 10  MG TABS tablet 1 tablet Orally Once a day for 30 days     fluticasone (FLONASE) 50 MCG/ACT nasal spray Place 2 sprays into both nostrils as needed.      Fremanezumab-vfrm (AJOVY) 225 MG/1.5ML SOAJ Inject 225 mg into the skin every 30 (thirty) days. 1.68 mL 11   Loratadine (CLARITIN PO) Take 10 mg  by mouth daily.     meloxicam (MOBIC) 15 MG tablet 1 tablet Orally Once a day     metoprolol tartrate (LOPRESSOR) 25 MG tablet TAKE 1 TABLET 2 HOURS PRIOR TO CT 1 tablet 0   Multiple Vitamins-Minerals (CENTRUM SILVER 50+MEN) TABS Take 1 tablet by mouth daily.     mupirocin ointment (BACTROBAN) 2 % Apply topically 2 (two) times daily.     nortriptyline (PAMELOR) 10 MG capsule 2 capsules Orally Once a day     ondansetron (ZOFRAN ODT) 4 MG disintegrating tablet Take 1 tablet (4 mg total) by mouth every 8 (eight) hours as needed for nausea or vomiting. 20 tablet 6   Rimegepant Sulfate (NURTEC) 75 MG TBDP Take 75 mg by mouth as needed (Take 1 tablet at onset of headache, max is 75 mg in 24 hours). 8 tablet 11   Vitamin D, Ergocalciferol, (DRISDOL) 1.25 MG (50000 UNIT) CAPS capsule Take 1 capsule (50,000 Units total) by mouth every 7 (seven) days. 4 capsule 0   zolpidem (AMBIEN) 5 MG tablet Take 5 mg by mouth at bedtime as needed for sleep.     No facility-administered medications prior to visit.    PAST MEDICAL HISTORY: Past Medical History:  Diagnosis Date   Arthritis    Atypical chest pain 10/24/2017   Atypical chest pain 10/24/2017   Back pain    Depression    GERD (gastroesophageal reflux disease)    occ   Graves disease 12/29/2017   Joint pain    Left knee pain    Migraine    Morbid obesity (Judsonia) 10/24/2017   Neck pain    Night sweats 10/24/2017   Numbness and tingling in left arm    Numbness and tingling in left hand    OSA (obstructive sleep apnea) 10/24/2017   Other fatigue    Polyuria 10/24/2017   PTSD (post-traumatic stress disorder)    Seasonal allergies    Sleep apnea    cpap 1 yr   Unintentional weight loss 10/24/2017    PAST SURGICAL HISTORY: Past Surgical History:  Procedure Laterality Date   Epidural Steroid Injections     KNEE SURGERY Left    arthroscopy (Pt reports 3 left knee procedures) last one done on 09/16/2020)   SHOULDER ARTHROSCOPY WITH  DEBRIDEMENT AND BICEP TENDON REPAIR     SHOULDER ARTHROSCOPY WITH SUBACROMIAL DECOMPRESSION, ROTATOR CUFF REPAIR AND BICEP TENDON REPAIR Right 04/09/2015   Procedure: RIGHT SHOULDER ARTHROSCOPY WITH SUBACROMIAL DECOMPRESSION, DISTAL  CLAVICLE RESECTION LABRAL AND ROTATOR CUFF DEBRIDEMENT;  Surgeon: Justice Britain, MD;  Location: [facility];  Service: Orthopedics;  Laterality: Right;   SHOULDER SURGERY Left    arthroscopy    FAMILY HISTORY: Family History  Problem Relation Age of Onset   Obesity Mother    Hypertension Mother    Ovarian cancer Mother    Cancer Mother        ovarian   Thyroid disease Mother    Drug abuse Father    Alcoholism Father    Cancer Father    Hypertension Father    Stomach cancer Father    Hypertension Sister  Hypertension Brother    Stroke Maternal Grandmother    Diabetes Maternal Grandmother    Heart attack Maternal Grandfather    Alcoholism Other    Drug abuse Other    Obesity Other     SOCIAL HISTORY: Social History   Socioeconomic History   Marital status: Married    Spouse name: Angie   Number of children: 2   Years of education: College    Highest education level: Not on file  Occupational History   Occupation: General Floor Help - [employer]   Occupation: [employer] engineer  Tobacco Use   Smoking status: Former    Packs/day: 0.25    Years: 5.00    Additional pack years: 0.00    Total pack years: 1.25    Types: Cigarettes    Quit date: 03/29/2005    Years since quitting: 17.8   Smokeless tobacco: Former  Scientific laboratory technician Use: Never used  Substance and Sexual Activity   Alcohol use: Yes    Alcohol/week: 0.0 standard drinks of alcohol    Comment: Social use - rare   Drug use: No   Sexual activity: Yes    Partners: Female  Other Topics Concern   Not on file  Social History Narrative   Lives with wife and two children.   Right-handed.   Occasional use of caffeine- soda   Social Determinants of Health   Financial Resource Strain:  Not on file  Food Insecurity: Not on file  Transportation Needs: Not on file  Physical Activity: Not on file  Stress: Not on file  Social Connections: Not on file  Intimate Partner Violence: Not on file   PHYSICAL EXAM  There were no vitals filed for this visit.  There is no height or weight on file to calculate BMI.  Generalized: Well developed, in no acute distress  Neurological examination  Mentation: Alert oriented to time, place, history taking. Follows all commands speech and language fluent Cranial nerve II-XII: Pupils were equal round reactive to light. Extraocular movements were full, visual field were full on confrontational test. Facial sensation and strength were normal. Head turning and shoulder shrug were normal and symmetric. Motor: The motor testing reveals 5 over 5 strength of all 4 extremities. Good symmetric motor tone is noted throughout.  Sensory: Sensory testing is intact to soft touch on all 4 extremities. No evidence of extinction is noted.  Coordination: Cerebellar testing reveals good finger-nose-finger and heel-to-shin bilaterally.  Gait and station: Gait is normal.  Reflexes: Deep tendon reflexes are symmetric and normal bilaterally.   DIAGNOSTIC DATA (LABS, IMAGING, TESTING) - I reviewed patient records, labs, notes, testing and imaging myself where available.  Lab Results  Component Value Date   WBC 6.7 07/05/2022   HGB 14.0 07/05/2022   HCT 41.0 07/05/2022   MCV 86.9 07/05/2022   PLT 294 07/05/2022      Component Value Date/Time   NA 140 10/06/2022 0939   K 4.3 10/06/2022 0939   CL 103 10/06/2022 0939   CO2 20 10/06/2022 0939   GLUCOSE 81 10/06/2022 0939   GLUCOSE 109 (H) 07/05/2022 1607   BUN 13 10/06/2022 0939   CREATININE 1.04 10/06/2022 0939   CALCIUM 9.9 10/06/2022 0939   PROT 7.2 10/06/2022 0939   ALBUMIN 4.7 10/06/2022 0939   AST 25 10/06/2022 0939   ALT 18 10/06/2022 0939   ALKPHOS 52 10/06/2022 0939   BILITOT 0.5 10/06/2022  0939   GFRNONAA >60 07/05/2022 1607   GFRAA  99 08/02/2018 0837   Lab Results  Component Value Date   CHOL 198 10/06/2022   HDL 53 10/06/2022   LDLCALC 134 (H) 10/06/2022   TRIG 61 10/06/2022   Lab Results  Component Value Date   HGBA1C 5.6 10/06/2022   Lab Results  Component Value Date   [id number] 05/13/2021   Lab Results  Component Value Date   TSH <0.006 (L) 10/24/2017    Butler Denmark, AGNP-C, DNP 01/25/2023, 3:38 PM [facility] [address], [place], [place] [address] [phone]

## 2023-01-25 NOTE — Telephone Encounter (Signed)
FMLA form faxed to [facility] (fax# [phone])

## 2023-01-26 ENCOUNTER — Encounter: Payer: Self-pay | Admitting: Neurology

## 2023-01-26 ENCOUNTER — Ambulatory Visit: Payer: 59 | Admitting: Neurology

## 2023-01-26 VITALS — BP 128/81 | HR 60 | Ht 77.0 in | Wt 347.0 lb

## 2023-01-26 DIAGNOSIS — G43709 Chronic migraine without aura, not intractable, without status migrainosus: Secondary | ICD-10-CM

## 2023-01-26 MED ORDER — QULIPTA 60 MG PO TABS
60.0000 mg | ORAL_TABLET | Freq: Every day | ORAL | 11 refills | Status: DC
Start: 2023-01-26 — End: 2023-05-05

## 2023-01-26 NOTE — Patient Instructions (Signed)
We will switch to [facility] for migraine prevention Try Roselyn Meier for migraine prevention, instead of Nurtec  Let me know how these do for you, if you like Roselyn Meier I will send in prescription

## 2023-02-03 ENCOUNTER — Encounter (HOSPITAL_BASED_OUTPATIENT_CLINIC_OR_DEPARTMENT_OTHER): Payer: Self-pay | Admitting: Family

## 2023-02-03 ENCOUNTER — Ambulatory Visit (HOSPITAL_BASED_OUTPATIENT_CLINIC_OR_DEPARTMENT_OTHER): Payer: 59 | Admitting: Family

## 2023-02-03 VITALS — BP 115/79 | HR 67 | Ht 77.0 in | Wt 350.1 lb

## 2023-02-03 DIAGNOSIS — Z6841 Body Mass Index (BMI) 40.0 and over, adult: Secondary | ICD-10-CM | POA: Diagnosis not present

## 2023-02-03 DIAGNOSIS — G4733 Obstructive sleep apnea (adult) (pediatric): Secondary | ICD-10-CM

## 2023-02-03 DIAGNOSIS — I2510 Atherosclerotic heart disease of native coronary artery without angina pectoris: Secondary | ICD-10-CM

## 2023-02-03 DIAGNOSIS — E0500 Thyrotoxicosis with diffuse goiter without thyrotoxic crisis or storm: Secondary | ICD-10-CM

## 2023-02-03 MED ORDER — SEMAGLUTIDE-WEIGHT MANAGEMENT 1 MG/0.5ML ~~LOC~~ SOAJ: 1.0000 mg | SUBCUTANEOUS | 0 refills | Status: AC

## 2023-02-03 MED ORDER — WEGOVY 0.25 MG/0.5ML ~~LOC~~ SOAJ: 0.2500 mg | SUBCUTANEOUS | 0 refills | Status: DC

## 2023-02-03 MED ORDER — SEMAGLUTIDE-WEIGHT MANAGEMENT 0.25 MG/0.5ML ~~LOC~~ SOAJ: 0.2500 mg | SUBCUTANEOUS | 0 refills | Status: AC

## 2023-02-03 MED ORDER — WEGOVY 0.5 MG/0.5ML ~~LOC~~ SOAJ: 0.5000 mg | SUBCUTANEOUS | 0 refills | Status: DC

## 2023-02-03 MED ORDER — SEMAGLUTIDE-WEIGHT MANAGEMENT 0.5 MG/0.5ML ~~LOC~~ SOAJ: 0.5000 mg | SUBCUTANEOUS | 0 refills | Status: DC

## 2023-02-03 MED ORDER — SEMAGLUTIDE-WEIGHT MANAGEMENT 2.4 MG/0.75ML ~~LOC~~ SOAJ: 2.4000 mg | SUBCUTANEOUS | 0 refills | Status: DC

## 2023-02-03 MED ORDER — SEMAGLUTIDE-WEIGHT MANAGEMENT 1.7 MG/0.75ML ~~LOC~~ SOAJ: 1.7000 mg | SUBCUTANEOUS | 0 refills | Status: AC

## 2023-02-03 NOTE — Patient Instructions (Addendum)
Medication Instructions:  Your physician has recommended you make the following change in your medication:   START Wegovy  For weeks 1-4: Inject 0.25mg  weekly For weeks 5-8: Inject 0.5mg  weekly For weeks 9-12: Inject 1mg  weekly For weeks 13-16: Inject 1.7mg  weekly Thereafter: Inject 2.4 mg weekly  *If you need a refill on your cardiac medications before your next appointment, please call your pharmacy*  Follow-Up: At [facility], you and your health needs are our priority.  As part of our continuing mission to provide you with exceptional heart care, we have created designated Provider Care Teams.  These Care Teams include your primary Cardiologist (physician) and Advanced Practice Providers (APPs -  Physician Assistants and Nurse Practitioners) who all work together to provide you with the care you need, when you need it.  We recommend signing up for the patient portal called "MyChart".  Sign up information is provided on this After Visit Summary.  MyChart is used to connect with patients for Virtual Visits (Telemedicine).  Patients are able to view lab/test results, encounter notes, upcoming appointments, etc.  Non-urgent messages can be sent to your provider as well.   To learn more about what you can do with MyChart, go to [email or web address].    Your next appointment:   3-4 month(s)  Provider:   Skeet Latch, MD or Laurann Montana, NP  Other Instructions  You have been prescribed a GLP-1 today called Wegovy.  This is a once weekly injection that helps you lose weight and maintain weight loss.  This medication works best when combined with healthy diet and regular exercise.  GLP1 medications help you lose weight by reducing appetite and helping you feel fuller longer. Most common side effects include nausea, vomiting, diarrhea. These side effects can be limited by eating slowly and eating small meals and often improve after a few days. We have Sent a prescription and  are awaiting prior authorization from insurance.

## 2023-02-03 NOTE — Progress Notes (Unsigned)
Office Visit    Patient Name: [name] Date of Encounter: 02/03/2023  PCP:  Lawerance Cruel, [place]  Cardiologist:  Skeet Latch, MD  Advanced Practice Provider:  No care team member to display Electrophysiologist:  None    Chief Complaint    [name] is a 53 y.o. male presents today for weight loss concerns   Past Medical History    Past Medical History:  Diagnosis Date   Arthritis    Atypical chest pain 10/24/2017   Atypical chest pain 10/24/2017   Back pain    Depression    GERD (gastroesophageal reflux disease)    occ   Graves disease 12/29/2017   Joint pain    Left knee pain    Migraine    Morbid obesity ([place]) 10/24/2017   Neck pain    Night sweats 10/24/2017   Numbness and tingling in left arm    Numbness and tingling in left hand    OSA (obstructive sleep apnea) 10/24/2017   Other fatigue    Polyuria 10/24/2017   PTSD (post-traumatic stress disorder)    Seasonal allergies    Sleep apnea    cpap 1 yr   Unintentional weight loss 10/24/2017   Past Surgical History:  Procedure Laterality Date   Epidural Steroid Injections     KNEE SURGERY Left    arthroscopy (Pt reports 3 left knee procedures) last one done on 09/16/2020)   SHOULDER ARTHROSCOPY WITH DEBRIDEMENT AND BICEP TENDON REPAIR     SHOULDER ARTHROSCOPY WITH SUBACROMIAL DECOMPRESSION, ROTATOR CUFF REPAIR AND BICEP TENDON REPAIR Right 04/09/2015   Procedure: RIGHT SHOULDER ARTHROSCOPY WITH SUBACROMIAL DECOMPRESSION, DISTAL  CLAVICLE RESECTION LABRAL AND ROTATOR CUFF DEBRIDEMENT;  Surgeon: Justice Britain, MD;  Location: [facility];  Service: Orthopedics;  Laterality: Right;   SHOULDER SURGERY Left    arthroscopy    Allergies  Allergies  Allergen Reactions   Sumatriptan Other (See Comments)    History of Present Illness    [name] is a 52 y.o. male with a hx of OSA, Grave's disease, obesity, nonobstructive coronary  calcification last seen 07/13/22 by Dr. Oval Linsey.  Seen 10/2017 for chest pain, shortness of breath, unintentional weight loss, night sweats. Found to be hyperthyroid treated by PCP with methimazole and metoprolol. ETT 10/217 negative for ischemia. It did not hypertensive response to exercise, frequent PAC, nonsustained atrial tachycardia. Coronary CTA 08/2018 normal coronary arteries with no CAD.   ED visit 07/05/22 with recurrent chest pain with elevated BP. He saw Dr. Oval Linsey 07/13/22 in follow up with repeat coronary CTA 08/18/22 with minimal nonobstructive CAD (1-24%) [LAD with 9 cubic mm non calcified plaque] recommended for preventive therapy and risk factor modification.  Presents today to request mediation assistance with weight loss. 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 Getting 150 minutes of exercise per week. [facility] to [facility] but notes he missed his last appointment and is re-establishing. Tries to eat decent. Notes he is not perfect but has made strides. Typically eats three meals per day. Eating meals such as boiled egg with protein like bacon or sausage for breakfast. Eats protein with breakfast, lunch, and dinner. Uses Splenda instead of sugar. Drinks 3 bottles of water per day and might do a Coke Zero once or twice a week. For a snack will have Atkins protein shake or yogurt with  fruit. For dinner often a salad. Did previously have success with intermittent fasting from 5:30PM to 7:30AM but has not been doing this recently.   Reports no shortness of breath nor dyspnea on exertion. Reports no chest pain, pressure, or tightness. No edema, orthopnea, PND. Reports no palpitations.    EKGs/Labs/Other Studies Reviewed:   The following studies were reviewed today:  Cardiac Studies & Procedures     STRESS TESTS  EXERCISE TOLERANCE TEST (ETT) 11/02/2017  Narrative  Blood pressure  demonstrated a hypertensive response to exercise.  There was no ST segment deviation noted during stress.  There was a hypertensive BP response to exercise up to 229/73mmHg. The SBP dropped 129mmHg at peak exericse although unclear if this was an erroneous reading as it was not rechecked. BP 1 minute into recovery was normal.  The patient achieved 10.63mets and 106% MPHR.  There were frequent PACs and nonsustained atrial tachycardia at peak exercise.      CT SCANS  CT CORONARY MORPH W/CTA COR W/SCORE 08/18/2022  Addendum 08/18/2022 12:54 PM ADDENDUM REPORT: 08/18/2022 12:51  EXAM: OVER-READ INTERPRETATION  CT CHEST  The following report is an over-read performed by radiologist Dr. West Bali [facility] on 08/18/2022. This over-read does not include interpretation of cardiac or coronary anatomy or pathology. The coronary CTA interpretation by the cardiologist is attached.  COMPARISON:  08/09/2018  FINDINGS: Aorta normal caliber. No pericardial effusion. Central pulmonary arteries grossly patent. Esophagus unremarkable. No adenopathy. Visualized upper abdomen normal. Minimal subsegmental atelectasis RIGHT base. Remaining visualized lungs clear. No osseous abnormalities.  IMPRESSION: Minimal subsegmental atelectasis RIGHT base.  No significant extracardiac abnormalities.   Electronically Signed By: Lavonia Dana M.D. On: 08/18/2022 12:51  Narrative CLINICAL DATA:  52 Year-old African American Male  EXAM: Cardiac/Coronary  CTA  TECHNIQUE: The patient was scanned on a [employer].  FINDINGS: Scan was triggered in the descending thoracic aorta. Axial non-contrast 3 mm slices were carried out through the heart. The data set was analyzed on a dedicated work station and scored using the [place]. Gantry rotation speed was 250 msecs and collimation was .6 mm. 0.8 mg of sl NTG was given. The 3D data set was reconstructed in 5% intervals  of the 67-82 % of the R-R cycle. Diastolic phases were analyzed on a dedicated work station using MPR, MIP and VRT modes. The patient received 100 cc of contrast.  Coronary Arteries:  Normal coronary origin.  Right dominance.  Coronary Calcium Score:  Left main: 0  Left anterior descending artery: 0  Left circumflex artery: 0  Right coronary artery: 0  Ramus intermedius artery: 0  Total: 0  Percentile: 1st for age, sex, and race matched control.  Plaque Analysis:  Left main: No plaque  Left anterior descending artery: 9 cubic mm non calcified plaque, 1 cubic mm low attenuation plaque, 9 cubic mm total plaque burden.  Left circumflex artery: 17 cubic mm non calcified plaque  Right coronary artery: 15 cubic mm non calcified plaque  Ramus intermedius artery: No plaque  Total: 41 cubic mm plaque  RCA is a large dominant artery that gives rise to PDA and PLA. There is no significant plaque.  Left main is a large artery that gives rise to LAD, RI, LCX arteries. There is no significant plaque.  LAD is a large vessel that gives rise to one large D1 Branch. There is no significant plaque.  LCX is a non-dominant artery that gives rise to small OM vessels.  There is no significant plaque.  There is a ramus intermedius vessel with no plaque. There is no significant plaque.  Other findings:  Aorta: Normal size.  No calcifications.  No dissection.  Main Pulmonary Artery: severe pulmonary artery dilation  Systemic Veins: Normal drainage  Aortic Valve:  Tri-leaflet.  No calcifications.  Mitral valve: No calcification.  Normal pulmonary vein drainage into the left atrium.  Normal left atrial appendage without a thrombus.  Interatrial septum with no communications.  Left Ventricle: Normal size  Left Atrium: Normal size  Right Ventricle: Dilated  Right Atrium: Normal size  Pericardium: Normal thickness  Extra-cardiac findings: See attached radiology report  for non-cardiac structures.  Artifact: BMI associated attenuation  IMPRESSION: 1. Coronary calcium score of 0. This was 1st percentile for age, sex, and race matched control.  2. Normal coronary origin with right dominance.  3. CAD-RADS 1. Minimal non-obstructive CAD (1-24%). Consider non-atherosclerotic causes of chest pain. Consider preventive therapy and risk factor modification.  4.  Pulmonary artery dilation noted.  RECOMMENDATIONS: RECOMMENDATIONS The proposed cut-off value of 1,651 AU yielded a 93 % sensitivity and 75 % specificity in grading AS severity in patients with classical low-flow, low-gradient AS. Proposed different cut-off values to define severe AS for men and women as 2,065 AU and 1,274 AU, respectively. The joint European and American recommendations for the assessment of AS consider the aortic valve calcium score as a continuum - a very high calcium score suggests severe AS and a low calcium score suggests severe AS is unlikely.  Kerman Passey, et al. 2017 ESC/EACTS Guidelines for the management of valvular heart disease. Eur Heart J 2017;38:2739-91.  Coronary artery calcium (CAC) score is a strong predictor of incident coronary heart disease (CHD) and provides predictive information beyond traditional risk factors. CAC scoring is reasonable to use in the decision to withhold, postpone, or initiate statin therapy in intermediate-risk or selected borderline-risk asymptomatic adults (age 53-75 years and LDL-C >=70 to <190 mg/dL) who do not have diabetes or established atherosclerotic cardiovascular disease (ASCVD).* In intermediate-risk (10-year ASCVD risk >=7.5% to <20%) adults or selected borderline-risk (10-year ASCVD risk >=5% to <7.5%) adults in whom a CAC score is measured for the purpose of making a treatment decision the following recommendations have been made:  If CAC = 0, it is reasonable to withhold statin therapy and  reassess in 5 to 10 years, as long as higher risk conditions are absent (diabetes mellitus, family history of premature CHD in first degree relatives (males <55 years; females <65 years), cigarette smoking, LDL >=190 mg/dL or other independent risk factors).  If CAC is 1 to 99, it is reasonable to initiate statin therapy for patients >=78 years of age.  If CAC is >=100 or >=75th percentile, it is reasonable to initiate statin therapy at any age.  Cardiology referral should be considered for patients with CAC scores =400 or >=75th percentile.  *2018 AHA/ACC/AACVPR/AAPA/ABC/ACPM/ADA/AGS/APhA/ASPC/NLA/PCNA Guideline on the Management of Blood Cholesterol: A Report of the American College of Cardiology/American Heart Association Task Force on Clinical Practice Guidelines. J Am Coll Cardiol. 2019;73(24):3168-3209.  Rudean Haskell, MD  Electronically Signed: By: Rudean Haskell M.D. On: 08/18/2022 11:57   CT SCANS  CT CORONARY MORPH W/CTA COR W/SCORE 08/09/2018  Addendum 08/09/2018  8:10 PM ADDENDUM REPORT: 08/09/2018 20:08  CLINICAL DATA:  27M with atypical chest pain.  EXAM: Cardiac/Coronary  CT  TECHNIQUE: The patient was scanned on a [employer].  FINDINGS: A 120  kV prospective scan was triggered in the descending thoracic aorta at 111 HU's. Axial non-contrast 3 mm slices were carried out through the heart. The data set was analyzed on a dedicated work station and scored using the [place]. Gantry rotation speed was 250 msecs and collimation was .6 mm. No beta blockade and 0.8 mg of sl NTG was given. The 3D data set was reconstructed in 5% intervals of the 67-82 % of the R-R cycle. Diastolic phases were analyzed on a dedicated work station using MPR, MIP and VRT modes. The patient received 100 cc of contrast.  Aorta:  Normal size.  No calcifications.  No dissection.  Aortic Valve:  Trileaflet.  No calcifications.  Coronary Arteries:   Normal coronary origin.  Right dominance.  RCA is a large dominant artery that gives rise to PDA and PLVB. There is no plaque.  Left main is a large artery that gives rise to LAD and LCX arteries.  LAD is a large vessel that gives rise to a large D1 and has no plaque.  LCX is a non-dominant artery that gives rise to a small OM1 and large OM2 branch. There is no plaque.  RI is a normal size vessel that has no plaque.  Other findings:  Normal pulmonary vein drainage into the left atrium.  Normal let atrial appendage without a thrombus.  Normal size of the pulmonary artery.  Mild misregistration artifact noted.  IMPRESSION: 1. Coronary calcium score of 0. This was 0 percentile for age and sex matched control.  2. Normal coronary origin with right dominance.  3. No evidence of CAD.  Skeet Latch, MD   Electronically Signed By: Skeet Latch On: 08/09/2018 20:08  Narrative EXAM: OVER-READ INTERPRETATION  CT CHEST  The following report is an over-read performed by radiologist Dr. Rolm Baptise of [facility] [place] on 08/09/2018. This over-read does not include interpretation of cardiac or coronary anatomy or pathology. The coronary CTA interpretation by the cardiologist is attached.  COMPARISON:  06/11/2004  FINDINGS: Vascular: Heart is normal size.  Visualized aorta is normal caliber.  Mediastinum/Nodes: No adenopathy in the lower mediastinum or hila.  Lungs/Pleura: No confluent opacities or effusions.  Upper Abdomen: Imaging into the upper abdomen shows no acute findings.  Musculoskeletal: Chest wall soft tissues are unremarkable. No acute bony abnormality.  IMPRESSION: No acute or significant extracardiac abnormality.  Electronically Signed: By: Rolm Baptise M.D. On: 08/09/2018 09:23           EKG:  EKG is not ordered today.  Recent Labs: 07/05/2022: Hemoglobin 14.0; Platelets 294 10/06/2022: ALT 18; BUN 13; Creatinine, Ser 1.04;  Potassium 4.3; Sodium 140  Recent Lipid Panel    Component Value Date/Time   CHOL 198 10/06/2022 0939   TRIG 61 10/06/2022 0939   HDL 53 10/06/2022 0939   LDLCALC 134 (H) 10/06/2022 0939   Home Medications   Current Meds  Medication Sig   ALPRAZolam (XANAX) 0.5 MG tablet Take 1-2 tablets 45 minutes before MRI, may take a 3rd if needed before going into scanner, MUST HAVE DRIVER   Atogepant (QULIPTA) 60 MG TABS Take 1 tablet (60 mg total) by mouth daily.   Butalbital-APAP-Caffeine 50-325-40 MG capsule Take 1 capsule by mouth as needed.   empagliflozin (JARDIANCE) 10 MG TABS tablet 1 tablet Orally Once a day for 30 days   fluticasone (FLONASE) 50 MCG/ACT nasal spray Place 2 sprays into both nostrils as needed.    Loratadine (CLARITIN PO) Take 10 mg  by mouth daily.   meloxicam (MOBIC) 15 MG tablet 1 tablet Orally Once a day   metoprolol tartrate (LOPRESSOR) 25 MG tablet TAKE 1 TABLET 2 HOURS PRIOR TO CT   Multiple Vitamins-Minerals (CENTRUM SILVER 50+MEN) TABS Take 1 tablet by mouth daily.   mupirocin ointment (BACTROBAN) 2 % Apply topically 2 (two) times daily.   nortriptyline (PAMELOR) 10 MG capsule 2 capsules Orally Once a day   ondansetron (ZOFRAN ODT) 4 MG disintegrating tablet Take 1 tablet (4 mg total) by mouth every 8 (eight) hours as needed for nausea or vomiting.   Rimegepant Sulfate (NURTEC) 75 MG TBDP Take 75 mg by mouth as needed (Take 1 tablet at onset of headache, max is 75 mg in 24 hours).   Vitamin D, Ergocalciferol, (DRISDOL) 1.25 MG (50000 UNIT) CAPS capsule Take 1 capsule (50,000 Units total) by mouth every 7 (seven) days.   zolpidem (AMBIEN) 5 MG tablet Take 5 mg by mouth at bedtime as needed for sleep.     Review of Systems      All other systems reviewed and are otherwise negative except as noted above.  Physical Exam    VS:  BP 115/79 (BP Location: Right Arm, Patient Position: Sitting, Cuff Size: Large)   Pulse 67   Ht 6\' 5"  (1.956 m)   Wt (!) 350 lb 1.6 oz  (158.8 kg)   SpO2 94%   BMI 41.52 kg/m  , BMI Body mass index is 41.52 kg/m.  Wt Readings from Last 3 Encounters:  02/03/23 (!) 350 lb 1.6 oz (158.8 kg)  01/26/23 (!) 347 lb (157.4 kg)  10/06/22 (!) 335 lb (152 kg)    GEN: Well nourished, overweight, well developed, in no acute distress. HEENT: normal. Neck: Supple, no JVD, carotid bruits, or masses. Cardiac: RRR, no murmurs, rubs, or gallops. No clubbing, cyanosis, edema.  Radials/PT 2+ and equal bilaterally.  Respiratory:  Respirations regular and unlabored, clear to auscultation bilaterally. GI: Soft, nontender, nondistended. MS: No deformity or atrophy. Skin: Warm and dry, no rash. Neuro:  Strength and sensation are intact. Psych: Normal affect.  Assessment & Plan    BMI 41 - Weight loss via diet and exercise encouraged. Discussed the impact being overweight would have on cardiovascular risk. [facility]. Having difficulty losing weight despite 150 minutes of exercise per week. Will Rx Wegovy 0.25mg  weekly x 4 weeks ? 0.5mg  weekly x 4 weeks ? 1mg  weekly x 4 weeks ? 1.7mg  weekly x 4 weeks ? 2.4mg  weekly. Discussed mechanism of action and need to decrease portions, avoid greasy foods to prevent gastrointestinal side effects.  OSA - Cannot tolerate CPAP due to PTSD. Not candidate for Inspire unless BMI <40. Weight loss, as above.  Graves disease - Now stable. Not requiring beta blockers as tachycardia resolved.  Minimal nonobstructive coronary atherosclerosis - CTA 08/2022 with very  minimal plaque in coronary arteries. Not enough to cause symptoms. Heart healthy diet and regular cardiovascular exercise encouraged.  Consider statin at follow up. Not addressed at this clinic visit.          Disposition: Follow up in 3-4 month(s) with Skeet Latch, MD or APP.  Signed, Loel Dubonnet, NP 02/03/2023, 1:49 PM [facility]

## 2023-02-06 ENCOUNTER — Encounter (HOSPITAL_BASED_OUTPATIENT_CLINIC_OR_DEPARTMENT_OTHER): Payer: Self-pay | Admitting: Family

## 2023-02-20 ENCOUNTER — Telehealth: Payer: Self-pay

## 2023-02-20 ENCOUNTER — Other Ambulatory Visit (HOSPITAL_COMMUNITY): Payer: Self-pay

## 2023-02-22 NOTE — Telephone Encounter (Signed)
Pharmacy Patient Advocate Encounter  Prior Authorization for [facility] has been approved.    Effective dates: 02/20/23 through 09/22/23   Received notification from [facility] that prior authorization for [facility] is needed.    PA submitted on 02/20/23 Key [id number] Status is pending  Haze Rushing, CPhT Pharmacy Patient Advocate Specialist Direct Number: [phone] Fax: [phone]

## 2023-03-20 ENCOUNTER — Other Ambulatory Visit (HOSPITAL_BASED_OUTPATIENT_CLINIC_OR_DEPARTMENT_OTHER): Payer: Self-pay | Admitting: Family

## 2023-03-20 ENCOUNTER — Other Ambulatory Visit (HOSPITAL_BASED_OUTPATIENT_CLINIC_OR_DEPARTMENT_OTHER): Payer: Self-pay

## 2023-03-20 ENCOUNTER — Other Ambulatory Visit (INDEPENDENT_AMBULATORY_CARE_PROVIDER_SITE_OTHER): Payer: Self-pay | Admitting: Family Medicine

## 2023-03-20 DIAGNOSIS — Z6841 Body Mass Index (BMI) 40.0 and over, adult: Secondary | ICD-10-CM

## 2023-03-20 DIAGNOSIS — E559 Vitamin D deficiency, unspecified: Secondary | ICD-10-CM

## 2023-03-20 MED ORDER — SEMAGLUTIDE-WEIGHT MANAGEMENT 0.5 MG/0.5ML ~~LOC~~ SOAJ
0.5000 mg | SUBCUTANEOUS | 0 refills | Status: AC
Start: 2023-03-20 — End: 2023-04-17

## 2023-03-20 MED ORDER — WEGOVY 0.5 MG/0.5ML ~~LOC~~ SOAJ
0.5000 mg | SUBCUTANEOUS | 0 refills | Status: DC
Start: 2023-03-20 — End: 2023-12-26

## 2023-03-20 NOTE — Telephone Encounter (Signed)
From: Theron Arista To: Office of Alver Sorrow, NP Sent: 03/16/2023 6:43 AM EDT Subject: Medication Renewal Request  Refills have been requested for the following medications:   Semaglutide-Weight Management 0.5 MG/0.5ML SOAJ [Caitlin S Walker]  Preferred pharmacy: [id number] [place], [place] - [address] Delivery method: [employer]

## 2023-03-20 NOTE — Telephone Encounter (Signed)
Please review for refill. Thank you! 

## 2023-03-21 ENCOUNTER — Ambulatory Visit (INDEPENDENT_AMBULATORY_CARE_PROVIDER_SITE_OTHER): Payer: 59 | Admitting: Family Medicine

## 2023-03-30 ENCOUNTER — Encounter (INDEPENDENT_AMBULATORY_CARE_PROVIDER_SITE_OTHER): Payer: Self-pay | Admitting: Family Medicine

## 2023-03-30 ENCOUNTER — Ambulatory Visit (INDEPENDENT_AMBULATORY_CARE_PROVIDER_SITE_OTHER): Payer: 59 | Admitting: Family Medicine

## 2023-03-30 VITALS — BP 107/65 | HR 62 | Temp 97.8°F | Ht 77.0 in | Wt 314.0 lb

## 2023-03-30 DIAGNOSIS — Z6837 Body mass index (BMI) 37.0-37.9, adult: Secondary | ICD-10-CM | POA: Diagnosis not present

## 2023-03-30 DIAGNOSIS — E559 Vitamin D deficiency, unspecified: Secondary | ICD-10-CM | POA: Diagnosis not present

## 2023-03-30 DIAGNOSIS — E669 Obesity, unspecified: Secondary | ICD-10-CM

## 2023-03-30 DIAGNOSIS — E7800 Pure hypercholesterolemia, unspecified: Secondary | ICD-10-CM | POA: Diagnosis not present

## 2023-03-30 MED ORDER — VITAMIN D (ERGOCALCIFEROL) 1.25 MG (50000 UNIT) PO CAPS: 50000.0000 [IU] | ORAL_CAPSULE | ORAL | 0 refills | Status: DC

## 2023-04-04 NOTE — Progress Notes (Unsigned)
Chief Complaint:   OBESITY [name] is here to discuss his progress with his obesity treatment plan along with follow-up of his obesity related diagnoses. [name] is on following a lower carbohydrate, vegetable and lean protein rich diet plan and states he is following his eating plan approximately 0% of the time. [name] states he is lifting weights, walking, playing golf, and biking for 80 minutes 3-4 times per week.  Today's visit was #: 10 Starting weight: 334 lbs Starting date: 05/13/2021 Today's weight: 314 lbs Today's date: 03/30/2023 Total lbs lost to date: 20 Total lbs lost since last in-office visit: 21  Interim History: Patient is last visit with [place] was approximately 6 months ago.  He has been going to [name] loss and he has been doing intermittent fasting and journaling, but his calories have been too low for his RMR and level of activity; and despite his exercise he has lost a significant amount of muscle mass.  Subjective:   1. Vitamin D deficiency Patient was last vitamin D level was very low, and not at goal.  2. Pure hypercholesterolemia Patient has been working on his diet, exercise, and weight loss.  His last LDL was at 134 and he is not on statin.  Assessment/Plan:   1. Vitamin D deficiency We will refill prescription vitamin D for 1 month, and we will recheck labs in 1 month.  - Vitamin D, Ergocalciferol, (DRISDOL) 1.25 MG (50000 UNIT) CAPS capsule; Take 1 capsule (50,000 Units total) by mouth every 7 (seven) days.  Dispense: 4 capsule; Refill: 0  2. Pure hypercholesterolemia Patient will continue to decrease cholesterol in his diet, and we will recheck labs in 3 to 4 weeks.  3. BMI 37.0-37.9, adult  4. Obesity, Beginning BMI 39.61 [name] is currently in the action stage of change. As such, his goal is to get back to weightloss efforts . He has agreed to change to keeping a food journal and adhering to recommended goals of  1500-1800 calories and 120+ grams of protein daily.   We will recheck fasting labs at his next visit.   Exercise goals: As is.   Behavioral modification strategies: no skipping meals.  [name] has agreed to follow-up with our clinic in 3 to 4 weeks. He was informed of the importance of frequent follow-up visits to maximize his success with intensive lifestyle modifications for his multiple health conditions.   Objective:   Blood pressure 107/65, pulse 62, temperature 97.8 F (36.6 C), height 6\' 5"  (1.956 m), weight (!) 314 lb (142.4 kg), SpO2 96 %. Body mass index is 37.23 kg/m.  Lab Results  Component Value Date   CREATININE 1.04 10/06/2022   BUN 13 10/06/2022   NA 140 10/06/2022   K 4.3 10/06/2022   CL 103 10/06/2022   CO2 20 10/06/2022   Lab Results  Component Value Date   ALT 18 10/06/2022   AST 25 10/06/2022   ALKPHOS 52 10/06/2022   BILITOT 0.5 10/06/2022   Lab Results  Component Value Date   HGBA1C 5.6 10/06/2022   HGBA1C 5.9 (H) 05/13/2021   HGBA1C 5.8 (H) 10/24/2017   Lab Results  Component Value Date   INSULIN 9.9 10/06/2022   INSULIN 15.6 05/13/2021   Lab Results  Component Value Date   TSH <0.006 (L) 10/24/2017   Lab Results  Component Value Date   CHOL 198 10/06/2022   HDL 53 10/06/2022   LDLCALC 134 (H) 10/06/2022   TRIG 61 10/06/2022  Lab Results  Component Value Date   VD25OH 26.3 (L) 10/06/2022   VD25OH 32.6 11/25/2021   VD25OH 28.1 (L) 05/13/2021   Lab Results  Component Value Date   WBC 6.7 07/05/2022   HGB 14.0 07/05/2022   HCT 41.0 07/05/2022   MCV 86.9 07/05/2022   PLT 294 07/05/2022   No results found for: "IRON", "TIBC", "FERRITIN"  Attestation Statements:   Reviewed by clinician on day of visit: allergies, medications, problem list, medical history, surgical history, family history, social history, and previous encounter notes.   I, Burt Knack, am acting as transcriptionist for Quillian Quince, MD.  I have  reviewed the above documentation for accuracy and completeness, and I agree with the above. -  Quillian Quince, MD

## 2023-04-06 ENCOUNTER — Other Ambulatory Visit (INDEPENDENT_AMBULATORY_CARE_PROVIDER_SITE_OTHER): Payer: Self-pay | Admitting: Family Medicine

## 2023-04-06 DIAGNOSIS — E559 Vitamin D deficiency, unspecified: Secondary | ICD-10-CM

## 2023-04-25 ENCOUNTER — Telehealth: Payer: Self-pay

## 2023-04-25 ENCOUNTER — Encounter: Payer: Self-pay | Admitting: Neurology

## 2023-04-25 ENCOUNTER — Encounter (HOSPITAL_BASED_OUTPATIENT_CLINIC_OR_DEPARTMENT_OTHER): Payer: Self-pay

## 2023-04-25 NOTE — Telephone Encounter (Signed)
Needs Botox auth. New start.   Chronic Migraine CPT 64615    Botox J0585 Units:200   G43.709    Chronic migraine w/o aura w/o status migrainosus, not intractable

## 2023-04-26 ENCOUNTER — Emergency Department (HOSPITAL_BASED_OUTPATIENT_CLINIC_OR_DEPARTMENT_OTHER)
Admission: EM | Admit: 2023-04-26 | Discharge: 2023-04-26 | Disposition: A | Discharge status: Home / Self Care | Payer: 59 | Attending: Emergency Medicine | Admitting: Emergency Medicine

## 2023-04-26 ENCOUNTER — Other Ambulatory Visit (INDEPENDENT_AMBULATORY_CARE_PROVIDER_SITE_OTHER): Payer: Self-pay | Admitting: Family Medicine

## 2023-04-26 ENCOUNTER — Emergency Department (HOSPITAL_BASED_OUTPATIENT_CLINIC_OR_DEPARTMENT_OTHER): Payer: 59 | Admitting: Radiology

## 2023-04-26 ENCOUNTER — Encounter (HOSPITAL_BASED_OUTPATIENT_CLINIC_OR_DEPARTMENT_OTHER): Payer: Self-pay

## 2023-04-26 ENCOUNTER — Other Ambulatory Visit: Payer: Self-pay

## 2023-04-26 DIAGNOSIS — E559 Vitamin D deficiency, unspecified: Secondary | ICD-10-CM

## 2023-04-26 DIAGNOSIS — M546 Pain in thoracic spine: Secondary | ICD-10-CM | POA: Diagnosis not present

## 2023-04-26 DIAGNOSIS — R072 Precordial pain: Secondary | ICD-10-CM

## 2023-04-26 DIAGNOSIS — R0789 Other chest pain: Secondary | ICD-10-CM | POA: Diagnosis present

## 2023-04-26 LAB — CBC
HCT: 41.2 % (ref 39.0–52.0)
Hemoglobin: 14.2 g/dL (ref 13.0–17.0)
MCH: 29.5 pg (ref 26.0–34.0)
MCHC: 34.5 g/dL (ref 30.0–36.0)
MCV: 85.7 fL (ref 80.0–100.0)
Platelets: 300 10*3/uL (ref 150–400)
RBC: 4.81 MIL/uL (ref 4.22–5.81)
RDW: 13.3 % (ref 11.5–15.5)
WBC: 6.3 10*3/uL (ref 4.0–10.5)
nRBC: 0 % (ref 0.0–0.2)

## 2023-04-26 LAB — TROPONIN I (HIGH SENSITIVITY)
Troponin I (High Sensitivity): 9 ng/L (ref <18)
Troponin I (High Sensitivity): 8 ng/L (ref <18)

## 2023-04-26 LAB — BASIC METABOLIC PANEL
Anion gap: 10 (ref 5–15)
BUN: 14 mg/dL (ref 6–20)
CO2: 24 mmol/L (ref 22–32)
Calcium: 9.7 mg/dL (ref 8.9–10.3)
Chloride: 105 mmol/L (ref 98–111)
Creatinine, Ser: 1.05 mg/dL (ref 0.61–1.24)
GFR, Estimated: >60 mL/min (ref >60)
Glucose, Bld: 121 mg/dL — ABNORMAL HIGH (ref 70–99)
Potassium: 3.6 mmol/L (ref 3.5–5.1)
Sodium: 139 mmol/L (ref 135–145)

## 2023-04-26 LAB — URINALYSIS, ROUTINE W REFLEX MICROSCOPIC
Bilirubin Urine: NEGATIVE
Glucose, UA: NEGATIVE mg/dL
Hgb urine dipstick: NEGATIVE
Ketones, ur: NEGATIVE mg/dL
Leukocytes,Ua: NEGATIVE
Nitrite: NEGATIVE
Protein, ur: NEGATIVE mg/dL
Specific Gravity, Urine: 1.017 (ref 1.005–1.030)
pH: 6 (ref 5.0–8.0)

## 2023-04-26 MED ORDER — NAPROXEN 250 MG PO TABS
500.0000 mg | ORAL_TABLET | Freq: Once | ORAL | Status: AC
  Administered 2023-04-26: 500 mg via ORAL
  Filled 2023-04-26: qty 2

## 2023-04-26 MED ORDER — ONABOTULINUMTOXINA 200 UNITS IJ SOLR: 200.0000 [IU] | INTRAMUSCULAR | 3 refills | Status: AC

## 2023-04-26 MED ORDER — METHOCARBAMOL 500 MG PO TABS
500.0000 mg | ORAL_TABLET | Freq: Once | ORAL | Status: AC
  Administered 2023-04-26: 500 mg via ORAL
  Filled 2023-04-26: qty 1

## 2023-04-26 MED ORDER — VITAMIN D (ERGOCALCIFEROL) 1.25 MG (50000 UNIT) PO CAPS
50000.0000 [IU] | ORAL_CAPSULE | ORAL | 0 refills | Status: DC
Start: 2023-04-26 — End: 2023-05-25

## 2023-04-26 NOTE — Telephone Encounter (Signed)
LAST APPOINTMENT DATE: 03/30/2023 NEXT APPOINTMENT DATE: 05/25/2023  he had an appt tomorrow, but it was a conflict with his [facility] app[facility]. So he rescheduled.   [id number] [address] [place] [address] Phone: [phone] Fax: [phone]  [facility] [place], [place] - [address] [place] [address] Phone: [phone] Fax: [phone]  Patient is requesting a refill of the following medications: Requested Prescriptions   Pending Prescriptions Disp Refills   Vitamin D, Ergocalciferol, (DRISDOL) 1.25 MG (50000 UNIT) CAPS capsule 4 capsule 0    Sig: Take 1 capsule (50,000 Units total) by mouth every 7 (seven) days.    Date last filled: 03/30/2023 Previously prescribed by Quillian Quince  Lab Results  Component Value Date   HGBA1C 5.6 10/06/2022   HGBA1C 5.9 (H) 05/13/2021   HGBA1C 5.8 (H) 10/24/2017   Lab Results  Component Value Date   LDLCALC 134 (H) 10/06/2022   CREATININE 1.04 10/06/2022   Lab Results  Component Value Date   VD25OH 26.3 (L) 10/06/2022   VD25OH 32.6 11/25/2021   VD25OH 28.1 (L) 05/13/2021    BP Readings from Last 3 Encounters:  03/30/23 107/65  02/03/23 115/79  01/26/23 128/81

## 2023-04-26 NOTE — Telephone Encounter (Signed)
Received approval from [facility] portal: [id number] (04/26/23-04/25/24).  Please send rx to [facility].

## 2023-04-26 NOTE — Telephone Encounter (Signed)
Patient came in office and stated he can not make his appointment tomorrow due to a [facility] appointment. We rescheduled his appointment but he is requesting a refill of his Vitamin D. Please contact patient to confirm. Thanks

## 2023-04-26 NOTE — ED Notes (Signed)
He endorses mid anterior thoracic pain and to a much less extent, right flank area discomfort x 3-4 days. His skin is normal, warm and dry and he is breathing normally.

## 2023-04-26 NOTE — Addendum Note (Signed)
Addended by: Eather Colas E on: 04/26/2023 10:08 AM   Modules accepted: Orders

## 2023-04-26 NOTE — Telephone Encounter (Signed)
Botox sent 200 units to [facility]

## 2023-04-26 NOTE — ED Provider Notes (Signed)
[place] EMERGENCY DEPARTMENT AT [facility] Provider Note   CSN: [id number] Arrival date & time: 04/26/23  1454     History  Chief Complaint  Patient presents with   Chest Pain    [name] is a 52 y.o. male.  HPI    52 year old male comes in with chief complaint of chest pain.  Patient has no significant past medical history.  He states that he has been having right-sided lower thoracic pain for the last several days.  The pain is intermittent.  Pain is worse with certain activities.  Pain is not worse with deep breathing and there is no cough.  Over the last 3 to 4 days he also has chest pain.  Chest pain is midsternal, intermittent, unprovoked, and has no specific aggravating or relieving factors.  Today the pain is more continuous, prompting him to come to the ER.  Pain has been continuous since this morning when he woke up.  Patient did play golf recently, but denies any trauma.  Pt has no hx of PE, DVT and denies any exogenous hormone (testosterone / estrogen) use, long distance travels or surgery in the past 6 weeks, active cancer, recent immobilization.   Home Medications Prior to Admission medications   Medication Sig Start Date End Date Taking? Authorizing Provider  Atogepant (QULIPTA) 60 MG TABS Take 1 tablet (60 mg total) by mouth daily. 01/26/23   Glean Salvo, NP  botulinum toxin Type A (BOTOX) 200 units injection Inject 200 Units into the muscle every 3 (three) months. 04/26/23   Glean Salvo, NP  Butalbital-APAP-Caffeine [phone] MG capsule Take 1 capsule by mouth as needed. 03/09/21   [provider]  fluticasone (FLONASE) 50 MCG/ACT nasal spray Place 2 sprays into both nostrils as needed.     [provider]  Loratadine (CLARITIN PO) Take 10 mg by mouth daily.    [provider]  meloxicam (MOBIC) 15 MG tablet 1 tablet Orally Once a day    [provider]  nortriptyline (PAMELOR) 10 MG capsule 2  capsules Orally Once a day    [provider]  ondansetron (ZOFRAN ODT) 4 MG disintegrating tablet Take 1 tablet (4 mg total) by mouth every 8 (eight) hours as needed for nausea or vomiting. 05/13/21   Levert Feinstein, MD  Rimegepant Sulfate (NURTEC) 75 MG TBDP Take 75 mg by mouth as needed (Take 1 tablet at onset of headache, max is 75 mg in 24 hours). 02/16/22   Glean Salvo, NP  Semaglutide-Weight Management (WEGOVY) 0.25 MG/0.5ML SOAJ Inject 0.25 mg into the skin once a week. FOR 4 WEEKS 02/03/23   Alver Sorrow, NP  Semaglutide-Weight Management (WEGOVY) 0.5 MG/0.5ML SOAJ Inject 0.5 mg into the skin once a week. FOR 4 WEEKS WHEN YOU FINISH THE 0.25 MG 03/20/23   Alver Sorrow, NP  Semaglutide-Weight Management 1 MG/0.5ML SOAJ Inject 1 mg into the skin once a week for 28 days. 04/02/23 04/30/23  Alver Sorrow, NP  Semaglutide-Weight Management 1.7 MG/0.75ML SOAJ Inject 1.7 mg into the skin once a week for 28 days. 05/01/23 05/29/23  Alver Sorrow, NP  Semaglutide-Weight Management 2.4 MG/0.75ML SOAJ Inject 2.4 mg into the skin once a week for 28 days. 05/30/23 06/27/23  Alver Sorrow, NP  Vitamin D, Ergocalciferol, (DRISDOL) 1.25 MG (50000 UNIT) CAPS capsule Take 1 capsule (50,000 Units total) by mouth every 7 (seven) days. 04/26/23   Wilder Glade, MD  zolpidem Remus Loffler)  5 MG tablet Take 5 mg by mouth at bedtime as needed for sleep.    [provider]      Allergies    Sumatriptan    Review of Systems   Review of Systems  All other systems reviewed and are negative.   Physical Exam Updated Vital Signs BP 113/71   Pulse 80   Temp (!) 97.5 F (36.4 C) (Oral)   Resp 16   Ht 6\' 5"  (1.956 m)   Wt (!) 143.8 kg   SpO2 96%   BMI 37.59 kg/m  Physical Exam Vitals and nursing note reviewed.  Constitutional:      Appearance: He is well-developed.  HENT:     Head: Atraumatic.  Cardiovascular:     Rate and Rhythm: Normal rate.  Pulmonary:     Effort: Pulmonary  effort is normal.     Breath sounds: Normal breath sounds.  Musculoskeletal:     Cervical back: Neck supple.  Skin:    General: Skin is warm.  Neurological:     Mental Status: He is alert and oriented to person, place, and time.     ED Results / Procedures / Treatments   Labs (all labs ordered are listed, but only abnormal results are displayed) Labs Reviewed  BASIC METABOLIC PANEL - Abnormal; Notable for the following components:      Result Value   Glucose, Bld 121 (*)    All other components within normal limits  CBC  TROPONIN I (HIGH SENSITIVITY)  TROPONIN I (HIGH SENSITIVITY)    EKG EKG Interpretation  Date/Time:  Wednesday April 26 2023 15:10:52 EDT Ventricular Rate:  71 PR Interval:  205 QRS Duration: 108 QT Interval:  369 QTC Calculation: 401 R Axis:   53 Text Interpretation: Sinus rhythm Borderline prolonged PR interval Anteroseptal infarct, age indeterminate No significant change since last tracing Confirmed by Derwood Kaplan [phone]) on 04/26/2023 3:20:55 PM  Radiology DG Chest 2 View  Result Date: 04/26/2023 CLINICAL DATA:  Chest pain EXAM: CHEST - 2 VIEW COMPARISON:  07/05/2022. FINDINGS: The heart size and mediastinal contours are within normal limits. No consolidation, effusion or pneumothorax. No edema. The visualized skeletal structures are unremarkable. IMPRESSION: No active cardiopulmonary disease. Electronically Signed   By: [name] Kays M.D.   On: 04/26/2023 15:53    Procedures Procedures    Medications Ordered in ED Medications  naproxen (NAPROSYN) tablet 500 mg (500 mg Oral Given 04/26/23 1547)  methocarbamol (ROBAXIN) tablet 500 mg (500 mg Oral Given 04/26/23 1547)    ED Course/ Medical Decision Making/ A&P             HEART Score: 2                Medical Decision Making Amount and/or Complexity of Data Reviewed Labs: ordered. Radiology: ordered.  Risk Prescription drug management.   This patient presents to the ED with chief  complaint(s) of chest pain and back pain.The complaint involves an extensive differential diagnosis and also carries with it a high risk of complications and morbidity.   With the chest pain, there is no pleurisy, exertional component to it or positional component to it.  No trauma.  Patient has pain in the right lower thoracic region, that appears to be different than the anterior chest pain.  There is no UTI-like symptoms.  No history of kidney stones or pyelonephritis.  The differential diagnosis considered for this patient includes pulmonary embolism, pneumonia, acute coronary syndrome, pyelonephritis, kidney stone, musculoskeletal  chest pain, esophageal spasm.  The initial plan is to get troponin, basic labs.  Will also get a urine analysis, although clinically this is not UTI or pyelonephritis.  Independent labs interpretation:  The following labs were independently interpreted: Patient's initial high-sensitivity troponin is normal.  His heart score is 2, no need for repeat Trop.  UA is pending, I have asked nursing staff to collect a urine and discharge him as clinically it is not can to change the management.  Independent visualization and interpretation of imaging: - I independently visualized the following imaging with scope of interpretation limited to determining acute life threatening conditions related to emergency care: chest xray, which revealed no evidence of pneumothorax  Treatment and Reassessment: The patient appears reasonably screened and/or stabilized for discharge and I doubt any other medical condition or other [facility] requiring further screening, evaluation, or treatment in the ED at this time prior to discharge.   Results from the ER workup discussed with the patient face to face and all questions answered to the best of my ability. The patient is safe for discharge with strict return precautions.   Final Clinical Impression(s) / ED Diagnoses Final diagnoses:  Precordial  chest pain  Right-sided thoracic back pain, unspecified chronicity    Rx / DC Orders ED Discharge Orders     None         Derwood Kaplan, MD 04/26/23 1831

## 2023-04-26 NOTE — ED Notes (Signed)
Patient transported to X-ray 

## 2023-04-26 NOTE — ED Triage Notes (Signed)
Patient here POV from Home.  Endorses Mid Chest pain for a few days. Some SOB. No N/V. Some Mid to Right Back pain as well.   NAD Noted during Triage. A&Ox4. GCS 15. Ambulatory

## 2023-04-27 ENCOUNTER — Ambulatory Visit (INDEPENDENT_AMBULATORY_CARE_PROVIDER_SITE_OTHER): Payer: 59 | Admitting: Family Medicine

## 2023-05-02 NOTE — Progress Notes (Signed)
Cardiology Office Note:  .   Date:  05/05/2023  ID:  [name], DOB [birth year], MRN [id number] PCP: Brian Floro, MD  [facility] Providers Cardiologist:  Chilton Si, MD    History of Present Illness: .   [name] is a 52 y.o. male with past medical history of OSA, Grave's disease, obesity, nonobstructive coronary calcification last seen 02/03/23.   Seen 10/2017 for chest pain, shortness of breath, unintentional weight loss, night sweats. Found to be hyperthyroid treated by PCP with methimazole and metoprolol. ETT 10/2017 negative for ischemia. It did note hypertensive response to exercise, frequent PAC, nonsustained atrial tachycardia. Coronary CTA 08/2018 normal coronary arteries with no CAD.   ED visit 07/05/22 with recurrent chest pain with elevated BP. On 07/13/22 he saw Dr. Duke Salvia in follow up with repeat coronary CTA 08/18/22 with minimal nonobstructive CAD (1-24%) (LAD with 9 cubic mm non calcified plaque) recommended for preventive therapy and risk factor modification.   Last seen on 02/03/23 by Brian Shields, NP. At that time was doing but well but was frustrated with inability to lose weight. He was started on Wegovy.   He presented to [facility] ED on 04/26/23 with chest pain and right sided lower thoracic pain that had been ongoing for several days. Chest pain was midsternal, intermittnet, unprovoked and had no specific aggravating or reliveing factors. Notes he had recently played golf. His cardiac workup in the ED was unremarkable including troponin negative x2.   Today he reports he is doing well, chest and thoracic pain that sent him to the ED has subsided. On discussion seems more musculoskeletal in nature. Reports he stopped wegovy a month ago due to constipation. He has recently changed some of his psych/migraine medications in hopes to improve GI problems and restarted Wegovy at 0.5mg  on Sunday. Currently seeing [facility] and [facility] to assist with weight loss, he has lost 40lbs since last visit in March. He has been working to improve his diet to assist with weight loss. Currently sees therapist for CBT regarding PTSD and insomnia, has history of sleep apnea, plans to have another sleep study with the [place] and to return to his prior Sleep Medicine practice.   ROS: He denies palpitations, dyspnea, pnd, orthopnea, n, v, dizziness, syncope, edema, weight gain, or early satiety. All other systems reviewed and are otherwise negative except as noted above.   Studies Reviewed: .       Cardiac Studies & Procedures     STRESS TESTS  EXERCISE TOLERANCE TEST (ETT) 11/02/2017  Narrative  Blood pressure demonstrated a hypertensive response to exercise.  There was no ST segment deviation noted during stress.  There was a hypertensive BP response to exercise up to 229/31mmHg. The SBP dropped at peak exericse although unclear if this was an erroneous reading as it was not rechecked. BP 1 minute into recovery was normal.  The patient achieved 10.60mets and 106% MPHR.  There were frequent PACs and nonsustained atrial tachycardia at peak exercise.      CT SCANS  CT CORONARY MORPH W/CTA COR W/SCORE 08/18/2022  Addendum 08/18/2022 12:54 PM ADDENDUM REPORT: 08/18/2022 12:51  EXAM: OVER-READ INTERPRETATION  CT CHEST  The following report is an over-read performed by radiologist Dr. Karle Barr [facility] on 08/18/2022. This over-read does not include interpretation of cardiac or coronary anatomy or pathology. The coronary CTA interpretation by the cardiologist is attached.  COMPARISON:  08/09/2018  FINDINGS: Aorta normal caliber. No pericardial effusion. Central pulmonary arteries grossly patent. Esophagus unremarkable. No adenopathy. Visualized upper abdomen normal. Minimal subsegmental atelectasis RIGHT base. Remaining visualized lungs clear. No  osseous abnormalities.  IMPRESSION: Minimal subsegmental atelectasis RIGHT base.  No significant extracardiac abnormalities.   Electronically Signed By: [name] M.D. On: 08/18/2022 12:51  Narrative CLINICAL DATA:  52 Year-old African American Male  EXAM: Cardiac/Coronary  CTA  TECHNIQUE: The patient was scanned on a [employer].  FINDINGS: Scan was triggered in the descending thoracic aorta. Axial non-contrast 3 mm slices were carried out through the heart. The data set was analyzed on a dedicated work station and scored using the Agatson method. Gantry rotation speed was 250 msecs and collimation was .6 mm. 0.8 mg of sl NTG was given. The 3D data set was reconstructed in 5% intervals of the 67-82 % of the R-R cycle. Diastolic phases were analyzed on a dedicated work station using MPR, MIP and VRT modes. The patient received 100 cc of contrast.  Coronary Arteries:  Normal coronary origin.  Right dominance.  Coronary Calcium Score:  Left main: 0  Left anterior descending artery: 0  Left circumflex artery: 0  Right coronary artery: 0  Ramus intermedius artery: 0  Total: 0  Percentile: 1st for age, sex, and race matched control.  Plaque Analysis:  Left main: No plaque  Left anterior descending artery: 9 cubic mm non calcified plaque, 1 cubic mm low attenuation plaque, 9 cubic mm total plaque burden.  Left circumflex artery: 17 cubic mm non calcified plaque  Right coronary artery: 15 cubic mm non calcified plaque  Ramus intermedius artery: No plaque  Total: 41 cubic mm plaque  RCA is a large dominant artery that gives rise to PDA and PLA. There is no significant plaque.  Left main is a large artery that gives rise to LAD, RI, LCX arteries. There is no significant plaque.  LAD is a large vessel that gives rise to one large D1 Branch. There is no significant plaque.  LCX is a non-dominant artery that gives rise to small OM  vessels. There is no significant plaque.  There is a ramus intermedius vessel with no plaque. There is no significant plaque.  Other findings:  Aorta: Normal size.  No calcifications.  No dissection.  Main Pulmonary Artery: severe pulmonary artery dilation  Systemic Veins: Normal drainage  Aortic Valve:  Tri-leaflet.  No calcifications.  Mitral valve: No calcification.  Normal pulmonary vein drainage into the left atrium.  Normal left atrial appendage without a thrombus.  Interatrial septum with no communications.  Left Ventricle: Normal size  Left Atrium: Normal size  Right Ventricle: Dilated  Right Atrium: Normal size  Pericardium: Normal thickness  Extra-cardiac findings: See attached radiology report for non-cardiac structures.  Artifact: BMI associated attenuation  IMPRESSION: 1. Coronary calcium score of 0. This was 1st percentile for age, sex, and race matched control.  2. Normal coronary origin with right dominance.  3. CAD-RADS 1. Minimal non-obstructive CAD (1-24%). Consider non-atherosclerotic causes of chest pain. Consider preventive therapy and risk factor modification.  4.  Pulmonary artery dilation noted.  RECOMMENDATIONS: RECOMMENDATIONS The proposed cut-off value of 1,651 AU yielded a 93 % sensitivity and 75 % specificity in grading AS severity in patients with classical low-flow, low-gradient AS. Proposed different cut-off values to define severe AS for men and women as 2,065 AU and 1,274 AU, respectively. The joint European and American recommendations for the assessment of AS consider  the aortic valve calcium score as a continuum - a very high calcium score suggests severe AS and a low calcium score suggests severe AS is unlikely.  Sunday Shams, et al. 2017 ESC/EACTS Guidelines for the management of valvular heart disease. Eur Heart J 2017;38:2739-91.  Coronary artery calcium (CAC) score is a strong predictor  of incident coronary heart disease (CHD) and provides predictive information beyond traditional risk factors. CAC scoring is reasonable to use in the decision to withhold, postpone, or initiate statin therapy in intermediate-risk or selected borderline-risk asymptomatic adults (age 62-75 years and LDL-C >=70 to <190 mg/dL) who do not have diabetes or established atherosclerotic cardiovascular disease (ASCVD).* In intermediate-risk (10-year ASCVD risk >=7.5% to <20%) adults or selected borderline-risk (10-year ASCVD risk >=5% to <7.5%) adults in whom a CAC score is measured for the purpose of making a treatment decision the following recommendations have been made:  If CAC = 0, it is reasonable to withhold statin therapy and reassess in 5 to 10 years, as long as higher risk conditions are absent (diabetes mellitus, family history of premature CHD in first degree relatives (males <55 years; females <65 years), cigarette smoking, LDL >=190 mg/dL or other independent risk factors).  If CAC is 1 to 99, it is reasonable to initiate statin therapy for patients >=11 years of age.  If CAC is >=100 or >=75th percentile, it is reasonable to initiate statin therapy at any age.  Cardiology referral should be considered for patients with CAC scores =400 or >=75th percentile.  *2018 AHA/ACC/AACVPR/AAPA/ABC/ACPM/ADA/AGS/APhA/ASPC/NLA/PCNA Guideline on the Management of Blood Cholesterol: A Report of the American College of Cardiology/American Heart Association Task Force on Clinical Practice Guidelines. J Am Coll Cardiol. 2019;73(24):3168-3209.  Riley Lam, MD  Electronically Signed: By: Riley Lam M.D. On: 08/18/2022 11:57   CT SCANS  CT CORONARY MORPH W/CTA COR W/SCORE 08/09/2018  Addendum 08/09/2018  8:10 PM ADDENDUM REPORT: 08/09/2018 20:08  CLINICAL DATA:  69M with atypical chest pain.  EXAM: Cardiac/Coronary  CT  TECHNIQUE: The patient was scanned on a  [employer].  FINDINGS: A 120 kV prospective scan was triggered in the descending thoracic aorta at 111 HU's. Axial non-contrast 3 mm slices were carried out through the heart. The data set was analyzed on a dedicated work station and scored using the Agatson method. Gantry rotation speed was 250 msecs and collimation was .6 mm. No beta blockade and 0.8 mg of sl NTG was given. The 3D data set was reconstructed in 5% intervals of the 67-82 % of the R-R cycle. Diastolic phases were analyzed on a dedicated work station using MPR, MIP and VRT modes. The patient received 100 cc of contrast.  Aorta:  Normal size.  No calcifications.  No dissection.  Aortic Valve:  Trileaflet.  No calcifications.  Coronary Arteries:  Normal coronary origin.  Right dominance.  RCA is a large dominant artery that gives rise to PDA and PLVB. There is no plaque.  Left main is a large artery that gives rise to LAD and LCX arteries.  LAD is a large vessel that gives rise to a large D1 and has no plaque.  LCX is a non-dominant artery that gives rise to a small OM1 and large OM2 branch. There is no plaque.  RI is a normal size vessel that has no plaque.  Other findings:  Normal pulmonary vein drainage into the left atrium.  Normal let atrial appendage without a thrombus.  Normal size of  the pulmonary artery.  Mild misregistration artifact noted.  IMPRESSION: 1. Coronary calcium score of 0. This was 0 percentile for age and sex matched control.  2. Normal coronary origin with right dominance.  3. No evidence of CAD.  Chilton Si, MD   Electronically Signed By: Chilton Si On: 08/09/2018 20:08  Narrative EXAM: OVER-READ INTERPRETATION  CT CHEST  The following report is an over-read performed by radiologist Dr. Charlett Nose of [facility] on 08/09/2018. This over-read does not include interpretation of cardiac or coronary anatomy or pathology. The  coronary CTA interpretation by the cardiologist is attached.  COMPARISON:  06/11/2004  FINDINGS: Vascular: Heart is normal size.  Visualized aorta is normal caliber.  Mediastinum/Nodes: No adenopathy in the lower mediastinum or hila.  Lungs/Pleura: No confluent opacities or effusions.  Upper Abdomen: Imaging into the upper abdomen shows no acute findings.  Musculoskeletal: Chest wall soft tissues are unremarkable. No acute bony abnormality.  IMPRESSION: No acute or significant extracardiac abnormality.  Electronically Signed: By: Charlett Nose M.D. On: 08/09/2018 09:23                 Physical Exam:   VS:  BP 118/75 (BP Location: Left Arm, Patient Position: Sitting, Cuff Size: Large)   Pulse 65   Ht 6\' 5"  (1.956 m)   Wt (!) 310 lb 8 oz (140.8 kg)   SpO2 95%   BMI 36.82 kg/m    Wt Readings from Last 3 Encounters:  05/05/23 (!) 310 lb 8 oz (140.8 kg)  04/26/23 (!) 317 lb (143.8 kg)  03/30/23 (!) 314 lb (142.4 kg)    GEN: Well nourished, well developed in no acute distress NECK: No JVD; No carotid bruits CARDIAC: RRR, no murmurs, rubs, gallops RESPIRATORY:  Clear to auscultation without rales, wheezing or rhonchi  ABDOMEN: Soft, non-tender, non-distended EXTREMITIES:  No edema; No deformity   ASSESSMENT AND PLAN: .    Minimal nonobstructive coronary atherosclerosis/Precordial chest pain/HLD: He had a repeat coronary CTA 08/18/22 with minimal nonobstructive CAD (1-24%) (LAD with 9 cubic mm non calcified plaque) recommended for preventive therapy and risk factor modification. ED visit on 04/26/23 for chest pain that had been ongoing for several days. Pain was mid sternal, intermittent and unprovoked. ED workup unremarkable. Today notes pain worsened with upper extremity movement, feel this was more musculoskeletal in nature given prior CTA results and unremarkable cardiac workup. Stable with no further anginal symptoms. No indication for ischemic evaluation.  Last lipid  panel 10/06/22 indicated LDL of 134 and cholesterol of 198.  Discussed starting rosuvastatin to reduce cardiac risk, he would prefer not to start daily at this time. Start rosuvastatin 10mg  three days a week. Check fasting lipid panel and LFTs today, repeat in 2-3 months.  Discussed starting aspirin 81mg  daily, he deferred at this time, to readdress at next OV.  BMI 36.8: Follows with [facility] and [facility] weight loss. Reports he had stopped wegovy a month ago for constipation. Restarted Wegovy 0.5mg  on Sunday. Heart healthy diet and regular cardiovascular exercise encouraged.  Continue up titration of Wegovy as tolerated.   OSA/Insmonia: Unable to tolerate CPAP due to PTSD. Reports ongoing insomnia that he is going to CBT for. [facility] and plans to [facility] to his Sleep Medicine Clinic.   Graves Disease: Reports he is doing well. Not requiring beta blocker therapy, now off methimazole.       Dispo: Follow up in 6  months with Dr. Duke Salvia or Brian Shields, NP   Signed, Rip Harbour, NP

## 2023-05-05 ENCOUNTER — Encounter (HOSPITAL_BASED_OUTPATIENT_CLINIC_OR_DEPARTMENT_OTHER): Payer: Self-pay | Admitting: Cardiology

## 2023-05-05 ENCOUNTER — Ambulatory Visit (HOSPITAL_BASED_OUTPATIENT_CLINIC_OR_DEPARTMENT_OTHER): Payer: 59 | Admitting: Cardiology

## 2023-05-05 VITALS — BP 118/75 | HR 65 | Ht 77.0 in | Wt 310.5 lb

## 2023-05-05 DIAGNOSIS — E0500 Thyrotoxicosis with diffuse goiter without thyrotoxic crisis or storm: Secondary | ICD-10-CM | POA: Diagnosis not present

## 2023-05-05 DIAGNOSIS — Z6836 Body mass index (BMI) 36.0-36.9, adult: Secondary | ICD-10-CM

## 2023-05-05 DIAGNOSIS — G4733 Obstructive sleep apnea (adult) (pediatric): Secondary | ICD-10-CM | POA: Diagnosis not present

## 2023-05-05 DIAGNOSIS — I2510 Atherosclerotic heart disease of native coronary artery without angina pectoris: Secondary | ICD-10-CM | POA: Diagnosis not present

## 2023-05-05 DIAGNOSIS — E785 Hyperlipidemia, unspecified: Secondary | ICD-10-CM | POA: Diagnosis not present

## 2023-05-05 MED ORDER — ROSUVASTATIN CALCIUM 10 MG PO TABS
ORAL_TABLET | ORAL | 3 refills | Status: DC
Start: 2023-05-05 — End: 2024-03-04

## 2023-05-05 NOTE — Patient Instructions (Addendum)
Medication Instructions:  Your physician has recommended you make the following change in your medication:  Start: Rosuvastatin 10mg  3 times per week   *If you need a refill on your cardiac medications before your next appointment, please call your pharmacy*   Lab Work: Your physician recommends that you return for lab work today- Lipid Panel and LFTs  Please return for Lab work in 2-3 months for Lipid Panel and LFTs  You may come to the...   [facility] (3rd floor) [address], [place], [place] [address]  Open: 8am-Noon and 1pm-4:30pm  Please ring the doorbell on the small table when you exit the elevator and the Lab Tech will come get you  [facility] at [facility] [address], [place], [place] [address] Open: 8am-1pm, then 2pm-4:30pm   [facility]- Please see attached locations sheet stapled to your lab work with address and hours.   If you have labs (blood work) drawn today and your tests are completely normal, you will receive your results only by: MyChart Message (if you have MyChart) OR A paper copy in the mail If you have any lab test that is abnormal or we need to change your treatment, we will call you to review the results.  Follow-Up: At [facility], you and your health needs are our priority.  As part of our continuing mission to provide you with exceptional heart care, we have created designated Provider Care Teams.  These Care Teams include your primary Cardiologist (physician) and Advanced Practice Providers (APPs -  Physician Assistants and Nurse Practitioners) who all work together to provide you with the care you need, when you need it.  We recommend signing up for the patient portal called "MyChart".  Sign up information is provided on this After Visit Summary.  MyChart is used to connect with patients for Virtual Visits (Telemedicine).  Patients are able to view lab/test results, encounter notes, upcoming  appointments, etc.  Non-urgent messages can be sent to your provider as well.   To learn more about what you can do with MyChart, go to [email or web address].    Your next appointment:   6 month follow up Dr. Duke Salvia or Gillian Shields, NP

## 2023-05-06 LAB — LIPID PANEL
Chol/HDL Ratio: 3.6 ratio (ref 0.0–5.0)
Cholesterol, Total: 185 mg/dL (ref 100–199)
HDL: 52 mg/dL (ref >39)
LDL Chol Calc (NIH): 122 mg/dL — ABNORMAL HIGH (ref 0–99)
Triglycerides: 56 mg/dL (ref 0–149)
VLDL Cholesterol Cal: 11 mg/dL (ref 5–40)

## 2023-05-06 LAB — HEPATIC FUNCTION PANEL
ALT: 11 IU/L (ref 0–44)
AST: 18 IU/L (ref 0–40)
Albumin: 4.7 g/dL (ref 3.8–4.9)
Alkaline Phosphatase: 60 IU/L (ref 44–121)
Bilirubin Total: 0.4 mg/dL (ref 0.0–1.2)
Bilirubin, Direct: 0.15 mg/dL (ref 0.00–0.40)
Total Protein: 7.3 g/dL (ref 6.0–8.5)

## 2023-05-08 ENCOUNTER — Other Ambulatory Visit (HOSPITAL_BASED_OUTPATIENT_CLINIC_OR_DEPARTMENT_OTHER): Payer: Self-pay | Admitting: Cardiology

## 2023-05-08 DIAGNOSIS — Z6841 Body Mass Index (BMI) 40.0 and over, adult: Secondary | ICD-10-CM

## 2023-05-14 ENCOUNTER — Other Ambulatory Visit (INDEPENDENT_AMBULATORY_CARE_PROVIDER_SITE_OTHER): Payer: Self-pay | Admitting: Family Medicine

## 2023-05-14 DIAGNOSIS — E559 Vitamin D deficiency, unspecified: Secondary | ICD-10-CM

## 2023-05-25 ENCOUNTER — Other Ambulatory Visit (HOSPITAL_BASED_OUTPATIENT_CLINIC_OR_DEPARTMENT_OTHER): Payer: Self-pay

## 2023-05-25 ENCOUNTER — Encounter (INDEPENDENT_AMBULATORY_CARE_PROVIDER_SITE_OTHER): Payer: Self-pay | Admitting: Family Medicine

## 2023-05-25 ENCOUNTER — Ambulatory Visit (INDEPENDENT_AMBULATORY_CARE_PROVIDER_SITE_OTHER): Payer: 59 | Admitting: Family Medicine

## 2023-05-25 VITALS — BP 100/62 | HR 68 | Temp 97.6°F | Ht 77.0 in | Wt 301.0 lb

## 2023-05-25 DIAGNOSIS — E785 Hyperlipidemia, unspecified: Secondary | ICD-10-CM

## 2023-05-25 DIAGNOSIS — E669 Obesity, unspecified: Secondary | ICD-10-CM

## 2023-05-25 DIAGNOSIS — E559 Vitamin D deficiency, unspecified: Secondary | ICD-10-CM | POA: Diagnosis not present

## 2023-05-25 DIAGNOSIS — Z6835 Body mass index (BMI) 35.0-35.9, adult: Secondary | ICD-10-CM

## 2023-05-25 MED ORDER — SEMAGLUTIDE-WEIGHT MANAGEMENT 1 MG/0.5ML ~~LOC~~ SOAJ
1.0000 mg | SUBCUTANEOUS | 0 refills | Status: DC
  Filled 2023-05-25: qty 2, 28d supply, fill #0

## 2023-05-25 MED ORDER — VITAMIN D (ERGOCALCIFEROL) 1.25 MG (50000 UNIT) PO CAPS: 50000.0000 [IU] | ORAL_CAPSULE | ORAL | 0 refills | Status: DC

## 2023-05-25 NOTE — Addendum Note (Signed)
Addended by: Verlon Setting on: 05/25/2023 03:13 PM   Modules accepted: Orders

## 2023-05-25 NOTE — Progress Notes (Signed)
.smr  Office: [phone]  /  Fax: [phone]  WEIGHT SUMMARY AND BIOMETRICS  Anthropometric Measurements Height: 6\' 5"  (1.956 m) Weight: (!) 301 lb (136.5 kg) BMI (Calculated): 35.69 Weight at Last Visit: 314 lb Weight Lost Since Last Visit: 13 lb Weight Gained Since Last Visit: 0 Starting Weight: 334 lb Total Weight Loss (lbs): 23 lb (10.4 kg)   Body Composition  Body Fat %: 34.9 % Fat Mass (lbs): 105.2 lbs Muscle Mass (lbs): 186.4 lbs Total Body Water (lbs): 124.2 lbs Visceral Fat Rating : 19   Other Clinical Data Fasting: No Labs: No Today's Visit #: 11 Starting Date: 05/13/21    Chief Complaint: OBESITY   History of Present Illness   The patient is a 52 year old male with a history of obesity, vitamin D deficiency, and polyphasia. Over the past two months, the patient has lost 13 pounds through portion control, smart dietary choices, and a regular exercise regimen consisting of cardio and strength training three times a week. The patient is also on prescription vitamin D and a GLP-1 for polyphasia.  The patient reports feeling generally well, with some improvement in hunger levels. He is not currently on any appetite control injections but is using lipotropic drops from a  [place] weight loss clinic. The patient follows a diet plan of five ounces of protein, vegetables, and fruit each, and practices intermittent fasting from 8 PM to 12 PM. If hunger strikes, he consumes a piece of protein or a natural protein shake.  However, recent body composition analysis shows that while the patient's fat mass and water weight have decreased, muscle mass has also dropped. This is a concern as it could lead to a decrease in metabolism.  The patient also has a history of high LDL cholesterol levels, which have been decreasing over the past few years. He has been prescribed a statin but has not started taking it due to concerns about potential side effects. He plans to discuss  this with his cardiologist at a follow-up visit in a few months.  The patient's blood pressure is well-controlled, and he is not on any medication that lowers blood pressure. The patient works long hours three days a week, which he reports as a source of stress.          PHYSICAL EXAM:  Blood pressure 100/62, pulse 68, temperature 97.6 F (36.4 C), height 6\' 5"  (1.956 m), weight (!) 301 lb (136.5 kg), SpO2 95%. Body mass index is 35.69 kg/m.  DIAGNOSTIC DATA REVIEWED:  BMET    Component Value Date/Time   NA 139 04/26/2023 1502   NA 140 10/06/2022 0939   K 3.6 04/26/2023 1502   CL 105 04/26/2023 1502   CO2 24 04/26/2023 1502   GLUCOSE 121 (H) 04/26/2023 1502   BUN 14 04/26/2023 1502   BUN 13 10/06/2022 0939   CREATININE 1.05 04/26/2023 1502   CALCIUM 9.7 04/26/2023 1502   GFRNONAA >60 04/26/2023 1502   GFRAA 99 08/02/2018 0837   Lab Results  Component Value Date   HGBA1C 5.6 10/06/2022   HGBA1C 5.8 (H) 10/24/2017   Lab Results  Component Value Date   INSULIN 9.9 10/06/2022   INSULIN 15.6 05/13/2021   Lab Results  Component Value Date   TSH <0.006 (L) 10/24/2017   CBC    Component Value Date/Time   WBC 6.3 04/26/2023 1502   RBC 4.81 04/26/2023 1502   HGB 14.2 04/26/2023 1502   HGB 14.8 05/13/2021 0946   HCT 41.2 04/26/2023  1502   HCT 44.4 05/13/2021 0946   PLT 300 04/26/2023 1502   PLT 272 05/13/2021 0946   MCV 85.7 04/26/2023 1502   MCV 87 05/13/2021 0946   MCH 29.5 04/26/2023 1502   MCHC 34.5 04/26/2023 1502   RDW 13.3 04/26/2023 1502   RDW 13.2 05/13/2021 0946   Iron Studies No results found for: "IRON", "TIBC", "FERRITIN", "IRONPCTSAT" Lipid Panel     Component Value Date/Time   CHOL 185 05/05/2023 0858   TRIG 56 05/05/2023 0858   HDL 52 05/05/2023 0858   CHOLHDL 3.6 05/05/2023 0858   LDLCALC 122 (H) 05/05/2023 0858   Hepatic Function Panel     Component Value Date/Time   PROT 7.3 05/05/2023 0858   ALBUMIN 4.7 05/05/2023 0858   AST 18  05/05/2023 0858   ALT 11 05/05/2023 0858   ALKPHOS 60 05/05/2023 0858   BILITOT 0.4 05/05/2023 0858   BILIDIR 0.15 05/05/2023 0858      Component Value Date/Time   TSH <0.006 (L) 10/24/2017 0922   Nutritional Lab Results  Component Value Date   VD25OH 26.3 (L) 10/06/2022   VD25OH 32.6 11/25/2021   VD25OH 28.1 (L) 05/13/2021     Assessment and Plan    Obesity: Lost 13 pounds in the last two months through portion control, smart food choices, and exercise. However, body composition analysis shows loss of muscle mass, which could lead to a decrease in metabolism. -Ensure intake of at least 120 grams of protein daily. -Maintain caloric intake above 1500 calories daily to prevent further muscle loss. -Continue current exercise regimen. -Follow-up in three months.  Vitamin D deficiency: On prescription vitamin D. Last prescription was for one month. No recent vitamin D level checked. -Refill vitamin D prescription for three months. -Check vitamin D level today.  Hyperlipidemia: LDL improved from 134 to 122. Patient has concerns about starting statin therapy. -Continue lifestyle modifications to lower LDL. -Discuss statin therapy with cardiologist at upcoming appointment.  General Health Maintenance: -Continue monitoring blood pressure, which is currently well-controlled.       He was informed of the importance of frequent follow up visits to maximize his success with intensive lifestyle modifications for his multiple health conditions.    Quillian Quince, MD

## 2023-05-26 LAB — VITAMIN D 25 HYDROXY (VIT D DEFICIENCY, FRACTURES): Vit D, 25-Hydroxy: 42.4 ng/mL (ref 30.0–100.0)

## 2023-06-18 ENCOUNTER — Other Ambulatory Visit (HOSPITAL_BASED_OUTPATIENT_CLINIC_OR_DEPARTMENT_OTHER): Payer: Self-pay | Admitting: Family

## 2023-06-19 ENCOUNTER — Encounter (HOSPITAL_BASED_OUTPATIENT_CLINIC_OR_DEPARTMENT_OTHER): Payer: Self-pay

## 2023-06-19 ENCOUNTER — Other Ambulatory Visit (HOSPITAL_BASED_OUTPATIENT_CLINIC_OR_DEPARTMENT_OTHER): Payer: Self-pay

## 2023-06-19 DIAGNOSIS — Z6841 Body Mass Index (BMI) 40.0 and over, adult: Secondary | ICD-10-CM

## 2023-06-19 NOTE — Telephone Encounter (Signed)
Has the 2.4 mg already been sent to pt pharmacy?

## 2023-06-21 ENCOUNTER — Other Ambulatory Visit (HOSPITAL_BASED_OUTPATIENT_CLINIC_OR_DEPARTMENT_OTHER): Payer: Self-pay | Admitting: Family

## 2023-06-21 DIAGNOSIS — Z6841 Body Mass Index (BMI) 40.0 and over, adult: Secondary | ICD-10-CM

## 2023-06-22 ENCOUNTER — Other Ambulatory Visit (HOSPITAL_BASED_OUTPATIENT_CLINIC_OR_DEPARTMENT_OTHER): Payer: Self-pay

## 2023-06-22 MED ORDER — SEMAGLUTIDE-WEIGHT MANAGEMENT 2.4 MG/0.75ML ~~LOC~~ SOAJ
2.4000 mg | SUBCUTANEOUS | 0 refills | Status: AC
Start: 2023-06-22 — End: 2023-07-24
  Filled 2023-06-22: qty 3, 28d supply, fill #0

## 2023-06-26 ENCOUNTER — Telehealth (HOSPITAL_BASED_OUTPATIENT_CLINIC_OR_DEPARTMENT_OTHER): Payer: Self-pay | Admitting: Cardiovascular Disease

## 2023-06-26 NOTE — Telephone Encounter (Signed)
Pt c/o medication issue:  1. Name of Medication:   Semaglutide-Weight Management (WEGOVY) 0.25 MG/0.5ML SOAJ   2. How are you currently taking this medication (dosage and times per day)?   As prescribed  3. Are you having a reaction (difficulty breathing--STAT)?  No  4. What is your medication issue?   Wife wants to confirm patient's dosage for this medication which was sent to the pharmacy.

## 2023-07-14 ENCOUNTER — Encounter: Payer: Self-pay | Admitting: Internal Medicine

## 2023-07-17 ENCOUNTER — Other Ambulatory Visit: Payer: Self-pay | Admitting: Internal Medicine

## 2023-07-17 DIAGNOSIS — R1012 Left upper quadrant pain: Secondary | ICD-10-CM

## 2023-07-25 ENCOUNTER — Ambulatory Visit: Payer: 59 | Admitting: Neurology

## 2023-08-01 ENCOUNTER — Ambulatory Visit
Admission: RE | Admit: 2023-08-01 | Discharge: 2023-08-01 | Disposition: A | Discharge status: Home / Self Care | Payer: 59 | Source: Ambulatory Visit | Attending: Internal Medicine | Admitting: Internal Medicine

## 2023-08-01 DIAGNOSIS — R1012 Left upper quadrant pain: Secondary | ICD-10-CM

## 2023-08-01 MED ORDER — IOPAMIDOL (ISOVUE-300) INJECTION 61%
500.0000 mL | Freq: Once | INTRAVENOUS | Status: AC | PRN
  Administered 2023-08-01: 100 mL via INTRAVENOUS

## 2023-08-02 ENCOUNTER — Ambulatory Visit: Payer: 59 | Admitting: Neurology

## 2023-08-31 ENCOUNTER — Ambulatory Visit (INDEPENDENT_AMBULATORY_CARE_PROVIDER_SITE_OTHER): Payer: 59 | Admitting: Family Medicine

## 2023-09-21 ENCOUNTER — Ambulatory Visit (INDEPENDENT_AMBULATORY_CARE_PROVIDER_SITE_OTHER): Payer: 59 | Admitting: Family Medicine

## 2023-09-21 ENCOUNTER — Encounter (INDEPENDENT_AMBULATORY_CARE_PROVIDER_SITE_OTHER): Payer: Self-pay | Admitting: Family Medicine

## 2023-09-21 VITALS — BP 103/60 | HR 65 | Temp 97.9°F | Ht 77.0 in | Wt 299.0 lb

## 2023-09-21 DIAGNOSIS — E669 Obesity, unspecified: Secondary | ICD-10-CM | POA: Diagnosis not present

## 2023-09-21 DIAGNOSIS — E559 Vitamin D deficiency, unspecified: Secondary | ICD-10-CM | POA: Diagnosis not present

## 2023-09-21 DIAGNOSIS — Z6835 Body mass index (BMI) 35.0-35.9, adult: Secondary | ICD-10-CM

## 2023-09-21 DIAGNOSIS — E785 Hyperlipidemia, unspecified: Secondary | ICD-10-CM | POA: Diagnosis not present

## 2023-09-21 DIAGNOSIS — R7303 Prediabetes: Secondary | ICD-10-CM | POA: Diagnosis not present

## 2023-09-21 DIAGNOSIS — E7849 Other hyperlipidemia: Secondary | ICD-10-CM

## 2023-09-21 MED ORDER — VITAMIN D (ERGOCALCIFEROL) 1.25 MG (50000 UNIT) PO CAPS
50000.0000 [IU] | ORAL_CAPSULE | ORAL | 0 refills | Status: DC
Start: 2023-09-21 — End: 2024-07-25

## 2023-09-21 NOTE — Progress Notes (Signed)
.smr  Office: [phone]  /  Fax: [phone]  WEIGHT SUMMARY AND BIOMETRICS  Anthropometric Measurements Height: 6\' 5"  (1.956 m) Weight: 299 lb (135.6 kg) BMI (Calculated): 35.45 Weight at Last Visit: 301 lb Weight Lost Since Last Visit: 2 lb Weight Gained Since Last Visit: 0 Starting Weight: 334 lb Total Weight Loss (lbs): 25 lb (11.3 kg)   Body Composition  Body Fat %: 35.1 % Fat Mass (lbs): 105 lbs Muscle Mass (lbs): 185 lbs Total Body Water (lbs): 126.2 lbs Visceral Fat Rating : 19   Other Clinical Data Fasting: No Labs: No Today's Visit #: 12 Starting Date: 05/28/21    Chief Complaint: OBESITY   History of Present Illness   The patient, with a history of pre-diabetes and obesity, presents for a follow-up visit after a four-month interval. He reports a weight loss of two pounds during this period, attributing it to portion control, smart dietary choices, and maintaining a calorie intake of around 1500 with at least 100 grams of protein. He has been engaging in one to two hours of exercise three to five times a week, including cardio, strengthening, and golf.  The patient was previously on GLP-1s per cardiology but is currently not on them. He reports discontinuing Wegovy, not due to insurance issues, but because he felt it was no longer necessary. He admits to occasional dietary slips, such as consuming cookies, cake, or chips, but overall, he has been making better food choices.  The patient has been experiencing tenderness in the stomach area for over eight years. A recent CT scan revealed an enlarged appendix, with a size of eleven instead of the normal ten, and it was producing mucus. He expresses a preference for having the appendix removed to prevent potential future complications.  The patient's last set of labs was over the summer, and he reports that his LDL cholesterol was under 102 at his last physical, which was in August or September. He has been taking  vitamin D supplements, which have improved his levels from 26 to 42, with a goal of reaching the fifties. He is also due for a hemoglobin A1c test, as his last results ranged from 5.6 to 5.9 over the past few years.          PHYSICAL EXAM:  Blood pressure 103/60, pulse 65, temperature 97.9 F (36.6 C), height 6\' 5"  (1.956 m), weight 299 lb (135.6 kg), SpO2 97%. Body mass index is 35.46 kg/m.  DIAGNOSTIC DATA REVIEWED:  BMET    Component Value Date/Time   NA 139 04/26/2023 1502   NA 140 10/06/2022 0939   K 3.6 04/26/2023 1502   CL 105 04/26/2023 1502   CO2 24 04/26/2023 1502   GLUCOSE 121 (H) 04/26/2023 1502   BUN 14 04/26/2023 1502   BUN 13 10/06/2022 0939   CREATININE 1.05 04/26/2023 1502   CALCIUM 9.7 04/26/2023 1502   GFRNONAA >60 04/26/2023 1502   GFRAA 99 08/02/2018 0837   Lab Results  Component Value Date   HGBA1C 5.6 10/06/2022   HGBA1C 5.8 (H) 10/24/2017   Lab Results  Component Value Date   INSULIN 9.9 10/06/2022   INSULIN 15.6 05/13/2021   Lab Results  Component Value Date   TSH <0.006 (L) 10/24/2017   CBC    Component Value Date/Time   WBC 6.3 04/26/2023 1502   RBC 4.81 04/26/2023 1502   HGB 14.2 04/26/2023 1502   HGB 14.8 05/13/2021 0946   HCT 41.2 04/26/2023 1502   HCT 44.4  05/13/2021 0946   PLT 300 04/26/2023 1502   PLT 272 05/13/2021 0946   MCV 85.7 04/26/2023 1502   MCV 87 05/13/2021 0946   MCH 29.5 04/26/2023 1502   MCHC 34.5 04/26/2023 1502   RDW 13.3 04/26/2023 1502   RDW 13.2 05/13/2021 0946   Iron Studies No results found for: "IRON", "TIBC", "FERRITIN", "IRONPCTSAT" Lipid Panel     Component Value Date/Time   CHOL 185 05/05/2023 0858   TRIG 56 05/05/2023 0858   HDL 52 05/05/2023 0858   CHOLHDL 3.6 05/05/2023 0858   LDLCALC 122 (H) 05/05/2023 0858   Hepatic Function Panel     Component Value Date/Time   PROT 7.3 05/05/2023 0858   ALBUMIN 4.7 05/05/2023 0858   AST 18 05/05/2023 0858   ALT 11 05/05/2023 0858   ALKPHOS  60 05/05/2023 0858   BILITOT 0.4 05/05/2023 0858   BILIDIR 0.15 05/05/2023 0858      Component Value Date/Time   TSH <0.006 (L) 10/24/2017 0922   Nutritional Lab Results  Component Value Date   VD25OH 42.4 05/25/2023   VD25OH 26.3 (L) 10/06/2022   VD25OH 32.6 11/25/2021     Assessment and Plan    Pre-diabetes Pre-diabetes with hemoglobin A1c ranging from 5.6 to 5.9. Currently managing through diet and exercise. Last A1c was 5.6 in 2023. Discussed rechecking A1c to monitor progress without medication. - Recheck hemoglobin A1c at the next visit in January - Continue current diet and exercise regimen  Obesity Obesity managed through portion control, smart food choices, and regular exercise. Lost two pounds since last visit four months ago. Discussed benefits of continued weight loss and avoiding high-calorie snacks and processed foods. - Continue current diet and exercise regimen - Encourage avoidance of high-calorie snacks and processed foods  Hyperlipidemia Elevated LDL cholesterol, previously improved from 134 to 122. Recent LDL under 100 without statins. Discussed maintaining LDL levels under 100 through dietary modifications and regular exercise. - Continue monitoring lipid levels - Encourage dietary modifications and regular exercise to maintain LDL levels under 100  Vitamin D Deficiency Vitamin D levels improved from 26 to 42, with a goal of reaching the 50s. Currently taking vitamin D supplements. Discussed maintaining adequate vitamin D levels, especially during fall and winter months. - Refill vitamin D supplements for 90 days - Recheck vitamin D levels at the next visit in January  General Health Maintenance Maintaining a healthy lifestyle with regular exercise and a balanced diet. Emphasized importance of lean proteins and vegetables for overall health. - Continue current exercise regimen - Maintain a balanced diet with lean proteins and vegetables  Follow-up -  Schedule follow-up visit in January - Include fasting labs for the next visit.        He was informed of the importance of frequent follow up visits to maximize his success with intensive lifestyle modifications for his multiple health conditions.    Quillian Quince, MD

## 2023-09-28 ENCOUNTER — Encounter: Payer: Self-pay | Admitting: Neurology

## 2023-09-28 ENCOUNTER — Ambulatory Visit: Payer: 59 | Admitting: Neurology

## 2023-09-28 NOTE — Progress Notes (Deleted)
Botox- 200 units x 1 vial Lot: [id number] Expiration: 06.2026 NDC: 0023-3921-02  Bacteriostatic 0.9% Sodium Chloride- 4 mL  Lot: [id number]  Expiration: 04.01.2025 NDC: 8469629528  Dx: U13.244 S/P Witnessed by April, RN

## 2023-10-26 ENCOUNTER — Ambulatory Visit: Payer: 59 | Admitting: Neurology

## 2023-11-28 ENCOUNTER — Other Ambulatory Visit (HOSPITAL_BASED_OUTPATIENT_CLINIC_OR_DEPARTMENT_OTHER): Payer: Self-pay | Admitting: Family

## 2023-11-29 NOTE — Telephone Encounter (Signed)
Called and spoke to pt. Pt wanting to see if Brian Shields, NP can renew [phone] to help with weight loss and A1C. [facility] requested he restart but cannot get a [facility] until July with [facility]. Told patient he is overdue for a 6 mo f/u to see MD/APP. Will let NP aware, review concerns and call back to schedule appt tomorrow. He verbalized understanding and was thankful for call.

## 2023-11-30 ENCOUNTER — Ambulatory Visit (INDEPENDENT_AMBULATORY_CARE_PROVIDER_SITE_OTHER): Payer: 59 | Admitting: Family Medicine

## 2023-11-30 ENCOUNTER — Other Ambulatory Visit (HOSPITAL_BASED_OUTPATIENT_CLINIC_OR_DEPARTMENT_OTHER): Payer: Self-pay

## 2023-11-30 NOTE — Telephone Encounter (Signed)
Called ans left VM for pt to call back.

## 2023-12-01 ENCOUNTER — Other Ambulatory Visit (HOSPITAL_BASED_OUTPATIENT_CLINIC_OR_DEPARTMENT_OTHER): Payer: Self-pay

## 2023-12-26 ENCOUNTER — Ambulatory Visit: Payer: 59 | Admitting: Neurology

## 2023-12-26 ENCOUNTER — Other Ambulatory Visit (HOSPITAL_BASED_OUTPATIENT_CLINIC_OR_DEPARTMENT_OTHER): Payer: Self-pay

## 2023-12-26 ENCOUNTER — Telehealth (HOSPITAL_BASED_OUTPATIENT_CLINIC_OR_DEPARTMENT_OTHER): Payer: Self-pay | Admitting: Cardiovascular Disease

## 2023-12-26 ENCOUNTER — Telehealth (INDEPENDENT_AMBULATORY_CARE_PROVIDER_SITE_OTHER): Payer: 59 | Admitting: Family

## 2023-12-26 VITALS — BP 111/74 | Ht 77.0 in | Wt 325.0 lb

## 2023-12-26 DIAGNOSIS — I272 Pulmonary hypertension, unspecified: Secondary | ICD-10-CM

## 2023-12-26 DIAGNOSIS — Z6838 Body mass index (BMI) 38.0-38.9, adult: Secondary | ICD-10-CM | POA: Diagnosis not present

## 2023-12-26 DIAGNOSIS — E785 Hyperlipidemia, unspecified: Secondary | ICD-10-CM

## 2023-12-26 DIAGNOSIS — I25118 Atherosclerotic heart disease of native coronary artery with other forms of angina pectoris: Secondary | ICD-10-CM

## 2023-12-26 DIAGNOSIS — R0609 Other forms of dyspnea: Secondary | ICD-10-CM

## 2023-12-26 MED ORDER — SEMAGLUTIDE-WEIGHT MANAGEMENT 0.25 MG/0.5ML ~~LOC~~ SOAJ
0.2500 mg | SUBCUTANEOUS | 0 refills | Status: AC
Start: 2023-12-26 — End: 2024-01-25
  Filled 2023-12-26: qty 2, 28d supply, fill #0

## 2023-12-26 MED ORDER — SEMAGLUTIDE-WEIGHT MANAGEMENT 1.7 MG/0.75ML ~~LOC~~ SOAJ
1.7000 mg | SUBCUTANEOUS | 0 refills | Status: DC
Start: 2024-03-22 — End: 2024-03-27
  Filled 2023-12-26: qty 3, 28d supply, fill #0

## 2023-12-26 MED ORDER — SEMAGLUTIDE-WEIGHT MANAGEMENT 0.5 MG/0.5ML ~~LOC~~ SOAJ
0.5000 mg | SUBCUTANEOUS | 0 refills | Status: DC
Start: 2024-01-24 — End: 2024-03-01
  Filled 2023-12-26 – 2024-01-31 (×2): qty 2, 28d supply, fill #0

## 2023-12-26 MED ORDER — SEMAGLUTIDE-WEIGHT MANAGEMENT 1 MG/0.5ML ~~LOC~~ SOAJ
1.0000 mg | SUBCUTANEOUS | 0 refills | Status: DC
Start: 2024-02-22 — End: 2024-03-27
  Filled 2023-12-26 – 2024-02-26 (×2): qty 2, 28d supply, fill #0

## 2023-12-26 MED ORDER — SEMAGLUTIDE-WEIGHT MANAGEMENT 2.4 MG/0.75ML ~~LOC~~ SOAJ
2.4000 mg | SUBCUTANEOUS | 2 refills | Status: AC
Start: 2024-04-20 — End: 2024-07-13
  Filled 2023-12-26 – 2024-05-01 (×2): qty 3, 28d supply, fill #0
  Filled 2024-06-01: qty 3, 28d supply, fill #1

## 2023-12-26 NOTE — Telephone Encounter (Signed)
 APP approved telephone visit request.

## 2023-12-26 NOTE — Telephone Encounter (Signed)
 Patient requested tele visit due to work conflict and unable to make in-office visit.

## 2023-12-26 NOTE — Progress Notes (Unsigned)
  Cardiology Office Note:  .   Date:  12/26/2023  ID:  [name], DOB [birth year], MRN [id number] PCP: Daisy Floro, MD  [facility] Providers Cardiologist:  Chilton Si, MD { Click to update primary MD,subspecialty MD or APP then REFRESH:1}   History of Present Illness: .   [name] is a 53 y.o. male ***nonobstrucitve CAD, Grave's disease, obesity,  OSA  A1c pre diabetes Configure your voice url 'let us know if can   ROS: ***  Studies Reviewed: .        *** Risk Assessment/Calculations:   {Does this patient have ATRIAL FIBRILLATION?[phone]}         Physical Exam:   VS:  BP 111/74 Comment: checked earlier today at visit  Ht 6\' 5"  (1.956 m)   Wt (!) 325 lb (147.4 kg)   BMI 38.54 kg/m    Wt Readings from Last 3 Encounters:  12/26/23 (!) 325 lb (147.4 kg)  09/21/23 299 lb (135.6 kg)  05/25/23 (!) 301 lb (136.5 kg)    GEN: Well nourished, well developed in no acute distress NECK: No JVD; No carotid bruits CARDIAC: ***RRR, no murmurs, rubs, gallops RESPIRATORY:  Clear to auscultation without rales, wheezing or rhonchi  ABDOMEN: Soft, non-tender, non-distended EXTREMITIES:  No edema; No deformity   ASSESSMENT AND PLAN: .   ***    {Are you ordering a CV Procedure (e.g. stress test, cath, DCCV, TEE, etc)?   Press F2        :[id number]}  Dispo: ***  Signed, Alver Sorrow, NP

## 2023-12-26 NOTE — Patient Instructions (Signed)
 Medication Instructions:   You have been prescribed a GLP-1 today called Wegovy.  This is a once weekly injection that helps you lose weight and maintain weight loss.  This medication works best when combined with healthy diet and regular exercise.  GLP1 medications help you lose weight by reducing appetite and helping you feel fuller longer. Most common side effects include nausea, vomiting, diarrhea. These side effects can be limited by eating slowly and eating small meals and often improve after a few days. We have Sent a prescription and are awaiting prior authorization from insurance.   Take ZOXWRU 0.25mg  weekly for 4 weeks. Then 0.5mg  weekly for 4 weeks. Then 1mg  weekly for 4 weeks. Then 1.7 weekly for 4 weeks. Then 2.4mg  weekly for 4 weeks.  *If you need a refill on your cardiac medications before your next appointment, please call your pharmacy*  Testing/Procedures: Your physician has requested that you have an echocardiogram. Echocardiography is a painless test that uses sound waves to create images of your heart. It provides your doctor with information about the size and shape of your heart and how well your heart's chambers and valves are working. This procedure takes approximately one hour. There are no restrictions for this procedure. Please do NOT wear cologne, perfume, aftershave, or lotions (deodorant is allowed). Please arrive 15 minutes prior to your appointment time.  Please note: We ask at that you not bring children with you during ultrasound (echo/ vascular) testing. Due to room size and safety concerns, children are not allowed in the ultrasound rooms during exams. Our front office staff cannot provide observation of children in our lobby area while testing is being conducted. An adult accompanying a patient to their appointment will only be allowed in the ultrasound room at the discretion of the ultrasound technician under special circumstances. We apologize for any  inconvenience.    Follow-Up: At [facility], you and your health needs are our priority.  As part of our continuing mission to provide you with exceptional heart care, we have created designated Provider Care Teams.  These Care Teams include your primary Cardiologist (physician) and Advanced Practice Providers (APPs -  Physician Assistants and Nurse Practitioners) who all work together to provide you with the care you need, when you need it.  We recommend signing up for the patient portal called "MyChart".  Sign up information is provided on this After Visit Summary.  MyChart is used to connect with patients for Virtual Visits (Telemedicine).  Patients are able to view lab/test results, encounter notes, upcoming appointments, etc.  Non-urgent messages can be sent to your provider as well.   To learn more about what you can do with MyChart, go to [email or web address].    Your next appointment:   April 25th at 8:25 AM with Alver Sorrow, NP  We will put you on the list for a visit with Dr. Duke Salvia July 2025 once that schedule opens  Other Instructions

## 2023-12-27 ENCOUNTER — Other Ambulatory Visit (HOSPITAL_COMMUNITY): Payer: Self-pay

## 2023-12-27 ENCOUNTER — Telehealth: Payer: Self-pay | Admitting: Pharmacy Technician

## 2023-12-27 ENCOUNTER — Other Ambulatory Visit (HOSPITAL_BASED_OUTPATIENT_CLINIC_OR_DEPARTMENT_OTHER): Payer: Self-pay

## 2023-12-27 NOTE — Telephone Encounter (Signed)
 Pharmacy Patient Advocate Encounter  Received notification from  [employer]  that Prior Authorization for [name] has been APPROVED from 12/27/23 to 07/26/24. Unable to obtain price due to refill too soon rejection, last fill date 12/27/23 next available fill date03/12/25   PA #/Case ID/Reference #: [id number]

## 2023-12-27 NOTE — Telephone Encounter (Signed)
 Pharmacy Patient Advocate Encounter   Received notification from Fax that prior authorization for wegovy is required/requested.   Insurance verification completed.   The patient is insured through  [employer]  .   Per test claim: PA required; PA submitted to above mentioned insurance via CoverMyMeds Key/confirmation #/EOC [id number] Status is pending

## 2023-12-28 ENCOUNTER — Encounter (HOSPITAL_BASED_OUTPATIENT_CLINIC_OR_DEPARTMENT_OTHER): Payer: Self-pay

## 2023-12-29 ENCOUNTER — Ambulatory Visit
Admission: RE | Admit: 2023-12-29 | Discharge: 2023-12-29 | Disposition: A | Discharge status: Home / Self Care | Payer: No Typology Code available for payment source | Source: Ambulatory Visit | Attending: Sports Medicine | Admitting: Sports Medicine

## 2023-12-29 ENCOUNTER — Other Ambulatory Visit: Payer: Self-pay | Admitting: Sports Medicine

## 2023-12-29 ENCOUNTER — Encounter: Payer: Self-pay | Admitting: Sports Medicine

## 2023-12-29 DIAGNOSIS — M25572 Pain in left ankle and joints of left foot: Secondary | ICD-10-CM

## 2023-12-29 DIAGNOSIS — M25562 Pain in left knee: Secondary | ICD-10-CM

## 2023-12-29 DIAGNOSIS — M25512 Pain in left shoulder: Secondary | ICD-10-CM

## 2024-01-18 ENCOUNTER — Ambulatory Visit (HOSPITAL_BASED_OUTPATIENT_CLINIC_OR_DEPARTMENT_OTHER)
Admission: RE | Admit: 2024-01-18 | Discharge: 2024-01-18 | Disposition: A | Discharge status: Home / Self Care | Payer: 59 | Source: Ambulatory Visit | Attending: Family | Admitting: Family

## 2024-01-18 ENCOUNTER — Encounter (HOSPITAL_BASED_OUTPATIENT_CLINIC_OR_DEPARTMENT_OTHER): Payer: Self-pay

## 2024-01-18 DIAGNOSIS — I272 Pulmonary hypertension, unspecified: Secondary | ICD-10-CM | POA: Insufficient documentation

## 2024-01-18 DIAGNOSIS — R0609 Other forms of dyspnea: Secondary | ICD-10-CM | POA: Insufficient documentation

## 2024-01-18 LAB — ECHOCARDIOGRAM COMPLETE
AR max vel: 2.6 cm2
AV Area VTI: 2.9 cm2
AV Area mean vel: 2.7 cm2
AV Mean grad: 5 mmHg
AV Peak grad: 8.1 mmHg
Ao pk vel: 1.42 m/s
Area-P 1/2: 3.77 cm2
Calc EF: 68.3 %
MV M vel: 2.02 m/s
MV Peak grad: 16.3 mmHg
S' Lateral: 2.9 cm
Single Plane A2C EF: 72.2 %
Single Plane A4C EF: 64.3 %

## 2024-01-31 ENCOUNTER — Other Ambulatory Visit (HOSPITAL_BASED_OUTPATIENT_CLINIC_OR_DEPARTMENT_OTHER): Payer: Self-pay

## 2024-02-16 ENCOUNTER — Ambulatory Visit (HOSPITAL_BASED_OUTPATIENT_CLINIC_OR_DEPARTMENT_OTHER): Payer: 59 | Admitting: Family

## 2024-02-26 ENCOUNTER — Other Ambulatory Visit (HOSPITAL_BASED_OUTPATIENT_CLINIC_OR_DEPARTMENT_OTHER): Payer: Self-pay

## 2024-03-01 ENCOUNTER — Encounter (HOSPITAL_BASED_OUTPATIENT_CLINIC_OR_DEPARTMENT_OTHER): Payer: Self-pay | Admitting: Family

## 2024-03-01 ENCOUNTER — Other Ambulatory Visit (HOSPITAL_BASED_OUTPATIENT_CLINIC_OR_DEPARTMENT_OTHER): Payer: Self-pay

## 2024-03-01 ENCOUNTER — Ambulatory Visit (HOSPITAL_BASED_OUTPATIENT_CLINIC_OR_DEPARTMENT_OTHER): Payer: 59 | Admitting: Family

## 2024-03-01 VITALS — BP 100/66 | HR 74 | Ht 77.0 in | Wt 319.0 lb

## 2024-03-01 DIAGNOSIS — E785 Hyperlipidemia, unspecified: Secondary | ICD-10-CM

## 2024-03-01 DIAGNOSIS — G4733 Obstructive sleep apnea (adult) (pediatric): Secondary | ICD-10-CM

## 2024-03-01 DIAGNOSIS — Z6837 Body mass index (BMI) 37.0-37.9, adult: Secondary | ICD-10-CM

## 2024-03-01 DIAGNOSIS — I25118 Atherosclerotic heart disease of native coronary artery with other forms of angina pectoris: Secondary | ICD-10-CM | POA: Diagnosis not present

## 2024-03-01 NOTE — Patient Instructions (Addendum)
 Medication Instructions:  Your physician recommends that you continue on your current medications as directed. Please refer to the Current Medication list given to you today.  LABS TODAY:   LIPID PANEL TODAY   Follow-Up: At [facility], you and your health needs are our priority.  As part of our continuing mission to provide you with exceptional heart care, our providers are all part of one team.  This team includes your primary Cardiologist (physician) and Advanced Practice Providers or APPs (Physician Assistants and Nurse Practitioners) who all work together to provide you with the care you need, when you need it.  Please follow up in SEPTEMBER  with Dr. Theodis Fiscal, Slater Duncan, NP or Neomi Banks, NP
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
## 2024-03-02 LAB — LIPID PANEL
Chol/HDL Ratio: 3.5 ratio (ref 0.0–5.0)
Cholesterol, Total: 192 mg/dL (ref 100–199)
HDL: 55 mg/dL (ref >39)
LDL Chol Calc (NIH): 128 mg/dL — ABNORMAL HIGH (ref 0–99)
Triglycerides: 50 mg/dL (ref 0–149)
VLDL Cholesterol Cal: 9 mg/dL (ref 5–40)

## 2024-03-04 ENCOUNTER — Encounter (HOSPITAL_BASED_OUTPATIENT_CLINIC_OR_DEPARTMENT_OTHER): Payer: Self-pay

## 2024-03-04 DIAGNOSIS — E785 Hyperlipidemia, unspecified: Secondary | ICD-10-CM

## 2024-03-04 DIAGNOSIS — I2510 Atherosclerotic heart disease of native coronary artery without angina pectoris: Secondary | ICD-10-CM

## 2024-03-04 MED ORDER — ROSUVASTATIN CALCIUM 10 MG PO TABS: ORAL_TABLET | ORAL | 3 refills | Status: AC

## 2024-03-04 NOTE — Addendum Note (Signed)
 Addended by: Guss Legacy on: 03/04/2024 10:28 AM   Modules accepted: Orders

## 2024-03-27 ENCOUNTER — Other Ambulatory Visit (HOSPITAL_BASED_OUTPATIENT_CLINIC_OR_DEPARTMENT_OTHER): Payer: Self-pay

## 2024-03-27 ENCOUNTER — Other Ambulatory Visit (HOSPITAL_BASED_OUTPATIENT_CLINIC_OR_DEPARTMENT_OTHER): Payer: Self-pay | Admitting: Family

## 2024-03-27 DIAGNOSIS — Z6838 Body mass index (BMI) 38.0-38.9, adult: Secondary | ICD-10-CM

## 2024-03-27 MED ORDER — SEMAGLUTIDE-WEIGHT MANAGEMENT 1.7 MG/0.75ML ~~LOC~~ SOAJ
1.7000 mg | SUBCUTANEOUS | 0 refills | Status: AC
  Filled 2024-03-27: qty 3, 28d supply, fill #0

## 2024-03-29 ENCOUNTER — Other Ambulatory Visit (HOSPITAL_BASED_OUTPATIENT_CLINIC_OR_DEPARTMENT_OTHER): Payer: Self-pay

## 2024-04-02 ENCOUNTER — Other Ambulatory Visit: Payer: Self-pay

## 2024-05-01 ENCOUNTER — Other Ambulatory Visit (HOSPITAL_BASED_OUTPATIENT_CLINIC_OR_DEPARTMENT_OTHER): Payer: Self-pay

## 2024-06-08 LAB — LIPID PANEL
Chol/HDL Ratio: 2.6 ratio (ref 0.0–5.0)
Cholesterol, Total: 135 mg/dL (ref 100–199)
HDL: 52 mg/dL (ref >39)
LDL Chol Calc (NIH): 68 mg/dL (ref 0–99)
Triglycerides: 74 mg/dL (ref 0–149)
VLDL Cholesterol Cal: 15 mg/dL (ref 5–40)

## 2024-06-08 LAB — HEPATIC FUNCTION PANEL
ALT: 15 IU/L (ref 0–44)
AST: 23 IU/L (ref 0–40)
Albumin: 4.5 g/dL (ref 3.8–4.9)
Alkaline Phosphatase: 56 IU/L (ref 44–121)
Bilirubin Total: 0.4 mg/dL (ref 0.0–1.2)
Bilirubin, Direct: 0.17 mg/dL (ref 0.00–0.40)
Total Protein: 6.8 g/dL (ref 6.0–8.5)

## 2024-06-10 ENCOUNTER — Ambulatory Visit (HOSPITAL_BASED_OUTPATIENT_CLINIC_OR_DEPARTMENT_OTHER): Payer: Self-pay | Admitting: Family

## 2024-07-24 ENCOUNTER — Encounter: Payer: Self-pay | Admitting: *Deleted

## 2024-07-25 ENCOUNTER — Ambulatory Visit (HOSPITAL_BASED_OUTPATIENT_CLINIC_OR_DEPARTMENT_OTHER): Admitting: Cardiovascular Disease

## 2024-07-25 ENCOUNTER — Encounter (HOSPITAL_BASED_OUTPATIENT_CLINIC_OR_DEPARTMENT_OTHER): Payer: Self-pay | Admitting: Cardiovascular Disease

## 2024-07-25 ENCOUNTER — Encounter (HOSPITAL_BASED_OUTPATIENT_CLINIC_OR_DEPARTMENT_OTHER): Payer: Self-pay | Admitting: *Deleted

## 2024-07-25 ENCOUNTER — Ambulatory Visit: Attending: Cardiology | Admitting: Pharmacist

## 2024-07-25 ENCOUNTER — Encounter: Payer: Self-pay | Admitting: Pharmacist

## 2024-07-25 ENCOUNTER — Other Ambulatory Visit (HOSPITAL_BASED_OUTPATIENT_CLINIC_OR_DEPARTMENT_OTHER): Payer: Self-pay

## 2024-07-25 VITALS — BP 120/84 | HR 63 | Ht 77.0 in | Wt 317.3 lb

## 2024-07-25 VITALS — Ht 77.0 in | Wt 315.0 lb

## 2024-07-25 DIAGNOSIS — Z6839 Body mass index (BMI) 39.0-39.9, adult: Secondary | ICD-10-CM

## 2024-07-25 DIAGNOSIS — E7800 Pure hypercholesterolemia, unspecified: Secondary | ICD-10-CM

## 2024-07-25 DIAGNOSIS — E785 Hyperlipidemia, unspecified: Secondary | ICD-10-CM

## 2024-07-25 DIAGNOSIS — E66812 Obesity, class 2: Secondary | ICD-10-CM

## 2024-07-25 DIAGNOSIS — I25118 Atherosclerotic heart disease of native coronary artery with other forms of angina pectoris: Secondary | ICD-10-CM | POA: Diagnosis not present

## 2024-07-25 DIAGNOSIS — I2510 Atherosclerotic heart disease of native coronary artery without angina pectoris: Secondary | ICD-10-CM | POA: Diagnosis not present

## 2024-07-25 DIAGNOSIS — R7303 Prediabetes: Secondary | ICD-10-CM

## 2024-07-25 DIAGNOSIS — G4733 Obstructive sleep apnea (adult) (pediatric): Secondary | ICD-10-CM
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
## 2024-07-25 NOTE — Progress Notes (Signed)
 Patient ID: [name]                 DOB: 09-Dec-1970                    MRN: [id number]     HPI: [name] is a 53 y.o. male patient referred to pharmacy clinic by Dr.Sayre to initiate GLP1-RA therapy. PMH is significant for  nonobstructive CAD, OSA, and Grave's disease , and obesity. Most recent BMI 37.35 kg/m .  Baseline weight and BMI: 325 lbs 38.53 kg/m  Current weight and BMI: 315 lbs 37.35  kg/m  Current meds that affect weight: none    Diet:  Go to healthy weight and wellness clinic and follow diet that is provided by nutritionist  Water intake - 80-100 oz per day  Drink: coke zero and pepsi zero   Exercise: play golf -once a week  Weights with cardio  2-3 times per week     Social History:  Alcohol: 1 beer every other week  Smoking: quit 1991  Labs: Lab Results  Component Value Date   HGBA1C 5.6 10/06/2022    Wt Readings from Last 1 Encounters:  07/25/24 (!) 315 lb (142.9 kg)    BP Readings from Last 1 Encounters:  07/25/24 120/84   Pulse Readings from Last 1 Encounters:  07/25/24 63       Component Value Date/Time   CHOL 135 06/07/2024 1428   TRIG 74 06/07/2024 1428   HDL 52 06/07/2024 1428   CHOLHDL 2.6 06/07/2024 1428   LDLCALC 68 06/07/2024 1428    Past Medical History:  Diagnosis Date   Arthritis    Atypical chest pain 10/24/2017   Atypical chest pain 10/24/2017   Back pain    Depression    GERD (gastroesophageal reflux disease)    occ   Graves disease 12/29/2017   Joint pain    Left knee pain    Migraine    Morbid obesity (HCC) 10/24/2017   Neck pain    Night sweats 10/24/2017   Numbness and tingling in left arm    Numbness and tingling in left hand    OSA (obstructive sleep apnea) 10/24/2017   Other fatigue    Polyuria 10/24/2017   PTSD (post-traumatic stress disorder)    Seasonal allergies    Sleep apnea    cpap 1 yr   Unintentional weight loss 10/24/2017    Current Outpatient Medications on  File Prior to Visit  Medication Sig Dispense Refill   acetaminophen  (TYLENOL ) 325 MG tablet Take 650 mg by mouth as needed.     botulinum toxin Type A (BOTOX) 200 units injection Inject 200 Units into the muscle every 3 (three) months. 1 each 3   Butalbital-APAP-Caffeine 50-325-40 MG capsule Take 1 capsule by mouth as needed.     Cholecalciferol (VITAMIN D -3 PO) Take by mouth daily.     cyclobenzaprine (FLEXERIL) 5 MG tablet Take 5 mg by mouth as needed.     Doxepin HCl 6 MG TABS Take by mouth at bedtime as needed.     fluticasone (FLONASE) 50 MCG/ACT nasal spray Place 2 sprays into both nostrils as needed.      Loratadine (CLARITIN PO) Take 10 mg by mouth daily.     nortriptyline  (PAMELOR ) 10 MG capsule 2 capsules Orally Once a day     ondansetron  (ZOFRAN  ODT) 4 MG disintegrating tablet Take 1 tablet (4 mg total) by mouth every 8 (eight) hours as  needed for nausea or vomiting. 20 tablet 6   Rimegepant Sulfate (NURTEC) 75 MG TBDP Take 75 mg by mouth as needed (Take 1 tablet at onset of headache, max is 75 mg in 24 hours). 8 tablet 11   rosuvastatin  (CRESTOR ) 10 MG tablet Take one tablet 3 times per week 36 tablet 3   traMADol-acetaminophen  (ULTRACET) 37.5-325 MG tablet Take 1 tablet by mouth every 4 (four) hours as needed.     zolpidem (AMBIEN) 5 MG tablet Take 5 mg by mouth at bedtime as needed for sleep.     No current facility-administered medications on file prior to visit.    Allergies  Allergen Reactions   Sumatriptan  Other (See Comments)     Assessment/Plan:  1. Weight loss -   Problem  Class 2 Severe Obesity With Serious Comorbidity and Body Mass Index (Bmi) of 39.0 to 39.9 in Adult (Hcc)   Class 2 severe obesity with serious comorbidity and body mass index (BMI) of 39.0 to 39.9 in adult [facility]) Patient was on Wegovy  for few months was able to reach to weight effective doses >1 mg once week. Despite heathy eating and regular exercise ( 150 min per week moderate to high  intensity along with weights) patient did not noted much weight loss his baseline before staring therapy was 325 lbs and his current weight is 315 lbs. Only side effects he had was constipation which was addressed with OTC laxatives   Recently Zepbound PA was denied we will re apply for the PA or appeal it. Sleep study was dose at [facility] so patient will get us  that sleep study report. Will submit the PA/appeal with sleep study.     Robbi Blanch, Pharm.D [facility]. [facility] [address], [place], [place] [address] Phone: [phone]; Fax: [phone]

## 2024-07-25 NOTE — Addendum Note (Signed)
 Addended by: Socrates Cahoon K on: 07/25/2024 12:11 PM   Modules accepted: Level of Service

## 2024-07-25 NOTE — Patient Instructions (Signed)
 Medication Instructions:  SEE PHARM D TO DISCUSS ZEPBOUND   *If you need a refill on your cardiac medications before your next appointment, please call your pharmacy*  Lab Work: NONE   Testing/Procedures: NONE  Follow-Up: At [facility], you and your health needs are our priority.  As part of our continuing mission to provide you with exceptional heart care, our providers are all part of one team.  This team includes your primary Cardiologist (physician) and Advanced Practice Providers or APPs (Physician Assistants and Nurse Practitioners) who all work together to provide you with the care you need, when you need it.  Your next appointment:   12 month(s)  Provider:   Annabella Scarce, MD    We recommend signing up for the patient portal called MyChart.  Sign up information is provided on this After Visit Summary.  MyChart is used to connect with patients for Virtual Visits (Telemedicine).  Patients are able to view lab/test results, encounter notes, upcoming appointments, etc.  Non-urgent messages can be sent to your provider as well.   To learn more about what you can do with MyChart, go to [email or web address].

## 2024-07-25 NOTE — Assessment & Plan Note (Signed)
 Patient was on Wegovy  for few months was able to reach to weight effective doses >1 mg once week. Despite heathy eating and regular exercise ( 150 min per week moderate to high intensity along with weights) patient did not noted much weight loss his baseline before staring therapy was 325 lbs and his current weight is 315 lbs. Only side effects he had was constipation which was addressed with OTC laxatives   Recently Zepbound PA was denied we will re apply for the PA or appeal it. Sleep study was dose at [facility] so patient will get us  that sleep study report. Will submit the PA/appeal with sleep study.

## 2024-07-29 ENCOUNTER — Other Ambulatory Visit (HOSPITAL_COMMUNITY): Payer: Self-pay

## 2024-07-29 ENCOUNTER — Telehealth: Payer: Self-pay | Admitting: Pharmacy Technician

## 2024-07-29 NOTE — Telephone Encounter (Signed)
   Pharmacy Patient Advocate Encounter   Received notification from [facility] that prior authorization for ZEPBOUND is required/requested.   Insurance verification completed.   The patient is insured through [facility] .   Per test claim: PA required; PA submitted to above mentioned insurance via Latent Key/confirmation #/EOC [facility] Status is pending   Insurance asked if he can be changed to [facility] and I said no since he is not diabetic

## 2024-07-30 NOTE — Telephone Encounter (Signed)
 Pharmacy Patient Advocate Encounter  Received notification from [facility] that Prior Authorization for zepbound has been DENIED.  Full denial letter will be uploaded to the media tab. See denial reason below.      Insurance wants mounjaro even though he is not diabetic

## 2024-07-31 NOTE — Telephone Encounter (Signed)
 Patient has been inform about Zepbound denial.

## 2024-08-04 ENCOUNTER — Encounter (HOSPITAL_BASED_OUTPATIENT_CLINIC_OR_DEPARTMENT_OTHER): Payer: Self-pay | Admitting: Cardiovascular Disease
